# Patient Record
Sex: Male | Born: 1950 | Race: White | Hispanic: No | Marital: Married | Smoking: Never smoker
Health system: Southern US, Community
[De-identification: ages and names within clinical notes are randomized; demographics above are authoritative.]

## PROBLEM LIST (undated history)

## (undated) DIAGNOSIS — E785 Hyperlipidemia, unspecified: Secondary | ICD-10-CM

## (undated) DIAGNOSIS — K635 Polyp of colon: Secondary | ICD-10-CM

## (undated) DIAGNOSIS — I4891 Unspecified atrial fibrillation: Secondary | ICD-10-CM

## (undated) DIAGNOSIS — R011 Cardiac murmur, unspecified: Secondary | ICD-10-CM

## (undated) HISTORY — DX: Cardiac murmur, unspecified: R01.1

## (undated) HISTORY — DX: Hyperlipidemia, unspecified: E78.5

## (undated) HISTORY — PX: PILONIDAL CYST EXCISION: SHX744

## (undated) HISTORY — DX: Polyp of colon: K63.5

## (undated) HISTORY — DX: Unspecified atrial fibrillation: I48.91

## (undated) HISTORY — PX: TONSILLECTOMY: SHX5217

---

## 2005-07-11 ENCOUNTER — Encounter: Payer: Self-pay | Admitting: Gastroenterology

## 2008-04-24 ENCOUNTER — Encounter: Payer: Self-pay | Admitting: Internal Medicine

## 2008-05-05 ENCOUNTER — Encounter: Payer: Self-pay | Admitting: Internal Medicine

## 2008-12-19 ENCOUNTER — Ambulatory Visit: Payer: Self-pay | Admitting: Internal Medicine

## 2008-12-19 ENCOUNTER — Encounter: Payer: Self-pay | Admitting: *Deleted

## 2008-12-19 DIAGNOSIS — E785 Hyperlipidemia, unspecified: Secondary | ICD-10-CM | POA: Insufficient documentation

## 2008-12-19 DIAGNOSIS — Z860100 Personal history of colon polyps, unspecified: Secondary | ICD-10-CM | POA: Insufficient documentation

## 2008-12-22 LAB — CONVERTED CEMR LAB
ALT: 23 units/L (ref 0–53)
Alkaline Phosphatase: 47 units/L (ref 39–117)
Bilirubin, Direct: 0 mg/dL (ref 0.0–0.3)
Cholesterol: 172 mg/dL (ref 0–200)
Total Bilirubin: 1.1 mg/dL (ref 0.3–1.2)
Total Protein: 6.8 g/dL (ref 6.0–8.3)

## 2009-04-30 ENCOUNTER — Ambulatory Visit: Payer: Self-pay | Admitting: Internal Medicine

## 2009-04-30 LAB — CONVERTED CEMR LAB
Alkaline Phosphatase: 36 units/L — ABNORMAL LOW (ref 39–117)
BUN: 12 mg/dL (ref 6–23)
Basophils Absolute: 0.1 10*3/uL (ref 0.0–0.1)
Basophils Relative: 0.9 % (ref 0.0–3.0)
Bilirubin Urine: NEGATIVE
Bilirubin, Direct: 0.1 mg/dL (ref 0.0–0.3)
CO2: 29 meq/L (ref 19–32)
Calcium: 8.6 mg/dL (ref 8.4–10.5)
Cholesterol: 155 mg/dL (ref 0–200)
Creatinine, Ser: 1 mg/dL (ref 0.4–1.5)
Eosinophils Absolute: 0.2 10*3/uL (ref 0.0–0.7)
HDL: 36.1 mg/dL — ABNORMAL LOW (ref >39.00)
Ketones, urine, test strip: NEGATIVE
Lymphocytes Relative: 25.6 % (ref 12.0–46.0)
MCHC: 34.3 g/dL (ref 30.0–36.0)
MCV: 94.1 fL (ref 78.0–100.0)
Monocytes Absolute: 0.4 10*3/uL (ref 0.1–1.0)
Neutrophils Relative %: 63.3 % (ref 43.0–77.0)
Nitrite: NEGATIVE
PSA: 0.59 ng/mL (ref 0.10–4.00)
Platelets: 219 10*3/uL (ref 150.0–400.0)
RBC: 4.48 M/uL (ref 4.22–5.81)
RDW: 11.7 % (ref 11.5–14.6)
Specific Gravity, Urine: 1.02
Total Bilirubin: 1.1 mg/dL (ref 0.3–1.2)
Total CHOL/HDL Ratio: 4
Triglycerides: 95 mg/dL (ref 0.0–149.0)
Urobilinogen, UA: 0.2

## 2009-05-07 ENCOUNTER — Ambulatory Visit: Payer: Self-pay | Admitting: Internal Medicine

## 2009-05-07 DIAGNOSIS — D1801 Hemangioma of skin and subcutaneous tissue: Secondary | ICD-10-CM | POA: Insufficient documentation

## 2009-05-07 DIAGNOSIS — L570 Actinic keratosis: Secondary | ICD-10-CM | POA: Insufficient documentation

## 2009-07-23 ENCOUNTER — Ambulatory Visit: Payer: Self-pay | Admitting: Internal Medicine

## 2009-11-19 ENCOUNTER — Ambulatory Visit: Payer: Self-pay | Admitting: Internal Medicine

## 2009-11-19 LAB — CONVERTED CEMR LAB
ALT: 27 units/L (ref 0–53)
Albumin: 4.4 g/dL (ref 3.5–5.2)
Bilirubin, Direct: 0.1 mg/dL (ref 0.0–0.3)
Cholesterol: 164 mg/dL (ref 0–200)
HDL: 39.4 mg/dL (ref >39.00)
LDL Cholesterol: 103 mg/dL — ABNORMAL HIGH (ref 0–99)
Total Protein: 6.6 g/dL (ref 6.0–8.3)
Triglycerides: 106 mg/dL (ref 0.0–149.0)
VLDL: 21.2 mg/dL (ref 0.0–40.0)

## 2009-11-26 ENCOUNTER — Ambulatory Visit: Payer: Self-pay | Admitting: Internal Medicine

## 2009-11-26 LAB — CONVERTED CEMR LAB
HDL goal, serum: 40 mg/dL
LDL Goal: 130 mg/dL

## 2010-05-03 ENCOUNTER — Ambulatory Visit: Payer: Self-pay | Admitting: Internal Medicine

## 2010-05-03 LAB — CONVERTED CEMR LAB
ALT: 29 units/L (ref 0–53)
AST: 29 units/L (ref 0–37)
Alkaline Phosphatase: 47 units/L (ref 39–117)
Basophils Relative: 0.8 % (ref 0.0–3.0)
Bilirubin, Direct: 0.1 mg/dL (ref 0.0–0.3)
Blood in Urine, dipstick: NEGATIVE
Calcium: 8.5 mg/dL (ref 8.4–10.5)
Creatinine, Ser: 0.9 mg/dL (ref 0.4–1.5)
Eosinophils Relative: 4.3 % (ref 0.0–5.0)
HDL: 38.5 mg/dL — ABNORMAL LOW (ref >39.00)
Hemoglobin: 13.7 g/dL (ref 13.0–17.0)
Ketones, urine, test strip: NEGATIVE
LDL Cholesterol: 108 mg/dL — ABNORMAL HIGH (ref 0–99)
Lymphocytes Relative: 26.5 % (ref 12.0–46.0)
Monocytes Relative: 7.5 % (ref 3.0–12.0)
Neutro Abs: 3.8 10*3/uL (ref 1.4–7.7)
Neutrophils Relative %: 60.9 % (ref 43.0–77.0)
Nitrite: NEGATIVE
RBC: 4.36 M/uL (ref 4.22–5.81)
Sodium: 140 meq/L (ref 135–145)
Specific Gravity, Urine: 1.02
Total CHOL/HDL Ratio: 4
Total Protein: 6.4 g/dL (ref 6.0–8.3)
Triglycerides: 117 mg/dL (ref 0.0–149.0)
Urobilinogen, UA: 0.2
WBC Urine, dipstick: NEGATIVE
WBC: 6.2 10*3/uL (ref 4.5–10.5)

## 2010-05-10 ENCOUNTER — Ambulatory Visit: Payer: Self-pay | Admitting: Internal Medicine

## 2010-06-01 ENCOUNTER — Telehealth: Payer: Self-pay | Admitting: Internal Medicine

## 2010-07-13 ENCOUNTER — Encounter: Payer: Self-pay | Admitting: Internal Medicine

## 2010-07-13 ENCOUNTER — Encounter (INDEPENDENT_AMBULATORY_CARE_PROVIDER_SITE_OTHER): Payer: Self-pay | Admitting: *Deleted

## 2010-07-15 ENCOUNTER — Encounter: Payer: Self-pay | Admitting: Gastroenterology

## 2010-07-30 ENCOUNTER — Encounter: Payer: Self-pay | Admitting: Internal Medicine

## 2010-07-30 ENCOUNTER — Ambulatory Visit
Admission: RE | Admit: 2010-07-30 | Discharge: 2010-07-30 | Payer: Self-pay | Source: Home / Self Care | Attending: Internal Medicine | Admitting: Internal Medicine

## 2010-07-30 ENCOUNTER — Telehealth: Payer: Self-pay

## 2010-07-30 DIAGNOSIS — R109 Unspecified abdominal pain: Secondary | ICD-10-CM | POA: Insufficient documentation

## 2010-07-30 LAB — CONVERTED CEMR LAB
Bilirubin Urine: NEGATIVE
Crystals: NONE SEEN
Glucose, Urine, Semiquant: NEGATIVE
Ketones, urine, test strip: NEGATIVE
Specific Gravity, Urine: 1.015
Squamous Epithelial / LPF: NONE SEEN /lpf
pH: 7

## 2010-08-03 NOTE — Assessment & Plan Note (Signed)
Summary: finger laceration/dm   Vital Signs:  Patient profile:   60 year old male Temp:     98.2 degrees F oral Pulse rate:   72 / minute Resp:     14 per minute  Vitals Entered By: Willy Eddy, LPN (July 23, 2009 8:39 AM)  CC:  thumb laceration last night at 6pm.  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 1 Once Daily  At [facility] 2)  Fish Oil Maximum Strength 1200 Mg Caps (Omega-3 Fatty Acids) .... ( 1700?)  Two Q Am and One  Q Pm 3)  Multivitamins  Caps (Multiple Vitamin) .Marland Kitchen.. 1 Once Daily 4)  Bayer Low Strength 81 Mg Tbec (Aspirin) .Marland Kitchen.. 1 Once Daily 5)  Vitamin D3 1000 Unit Tabs (Cholecalciferol) .... One  By Mouth Daily 6)  No Flush Niacin 500 Mg Tabs (Inositol Niacinate) .... One By Mouth Daily With A Low Fat Meal of Snack  Current Medications (verified): 1)  Simvastatin 20 Mg Tabs (Simvastatin) .Marland Kitchen.. 1 Once Daily  At [facility] 2)  Fish Oil Maximum Strength 1200 Mg Caps (Omega-3 Fatty Acids) .... ( 1700?)  Two Q Am and One  Q Pm 3)  Multivitamins  Caps (Multiple Vitamin) .Marland Kitchen.. 1 Once Daily 4)  Bayer Low Strength 81 Mg Tbec (Aspirin) .Marland Kitchen.. 1 Once Daily 5)  Vitamin D3 1000 Unit Tabs (Cholecalciferol) .... One  By Mouth Daily 6)  No Flush Niacin 500 Mg Tabs (Inositol  Niacinate) .... 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  Past Medical History: Reviewed history from 12/19/2008 and no changes required. Colonic polyps, hx of  last colon  2008  Hyperlipidemia hx of heart murmur  Past Surgical History: Reviewed history from 12/19/2008 and no changes required. pilonidal cyst  Tonsillectomy  Family History: Reviewed history from 12/19/2008 and no changes required. Family History of CAD Male  grandmother Family History High cholesterol   mother Family History of Dementia both parents had memory disorder ( mother is 19) father died with alzhiemers  Social History: Reviewed history from 12/19/2008 and no changes required. 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    Physical Exam  General:  Well-developed,well-nourished,in no acute distress; alert,appropriate and cooperative throughout examination Head:  normocephalic and male-pattern balding.   Lungs:  Normal respiratory effort, chest expands symmetrically. Lungs are clear to auscultation, no crackles or wheezes. Heart:  Normal rate and regular rhythm. S1 and S2 normal without gallop, murmur, click, rub or other extra sounds. Msk:  no joint tenderness, no joint swelling, and no joint warmth.   Skin:  laceration as described in the HPI Cervical Nodes:  No lymphadenopathy noted Axillary Nodes:  No palpable lymphadenopathy Psych:  Cognition and judgment appear intact. Alert and cooperative with normal attention span and concentration. No apparent delusions, illusions, hallucinations   Impression & Recommendations:  Problem # 1:  LACERATION OF FINGER (ICD-883.0) Assessment Unchanged  bleeding wound with shallow laceraton to finger unable to obtain hemostasis for 12 hours not suturable close with steristrips and occussive dressing ( coban) teach dreassing changes and wound care I have spent greater that 30 min face to face evaluating this patient 1/3 was counsilling/ care instructions  Orders: Coban Wrap,  <3 in/yd (U9811) Lacerat Simple STE 0 - 2.5 cm (12001)  Complete Medication List: 1)  Simvastatin 20 Mg Tabs (Simvastatin) .Marland Kitchen.. 1 once daily  at hs 2)  Fish Oil Maximum Strength 1200 Mg Caps (Omega-3 fatty acids) .... ( 1700?)  two q am and one  q pm 3)  Multivitamins Caps (Multiple vitamin) .Marland Kitchen.. 1 once daily 4)  Bayer Low Strength 81 Mg Tbec (Aspirin) .Marland Kitchen.. 1 once daily 5)  Vitamin D3 1000 Unit Tabs (Cholecalciferol) .... One  by mouth daily 6)  No Flush Niacin 500 Mg Tabs (Inositol niacinate) .... One by mouth daily with a low fat meal of snack  Appended Document: finger  laceration/dm     Allergies: No Known Drug Allergies   Impression & Recommendations:  Problem # 1:  LACERATION OF FINGER (ICD-883.0) clarification of prior note I have spent greater that 30 min face to face evaluating this patient of which 1/2 of time or greater was spent in wound care instructions and counsilling  Complete Medication List: 1)  Simvastatin 20 Mg Tabs (Simvastatin) .Marland Kitchen.. 1 once daily  at hs 2)  Fish Oil Maximum Strength 1200 Mg Caps (Omega-3 fatty acids) .... ( 1700?)  two q am and one  q pm 3)  Multivitamins Caps (Multiple vitamin) .Marland Kitchen.. 1 once daily 4)  Bayer Low Strength 81 Mg Tbec (Aspirin) .Marland Kitchen.. 1 once daily 5)  Vitamin D3 1000 Unit Tabs (Cholecalciferol) .... One  by mouth daily 6)  No Flush Niacin 500 Mg Tabs (Inositol niacinate) .... One by mouth daily with a low fat meal of snack

## 2010-08-03 NOTE — Assessment & Plan Note (Signed)
Summary: cpx/njr   Vital Signs:  Patient profile:   60 year old male Height:      70 inches Weight:      172 pounds BMI:     24.77 Temp:     98.2 degrees F oral Pulse rate:   72 / minute Resp:     14 per minute BP sitting:   126 / 78  (left arm)  Vitals Entered By: Willy Eddy, LPN (May 10, 2010 8:58 AM) CC: cpx and form completion Is Patient Diabetic? No   CC:  cpx and form completion.  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 1 Once  At Bed Time 2)  Fish Oil Maximum Strength 1200 Mg Caps (Omega-3 Fatty Acids) .... Two By Mouth Two Times A Day 3)  Multivitamins  Caps (Multiple Vitamin) .Marland Kitchen.. 1 Once Daily 4)  Bayer Low Strength 81 Mg Tbec (Aspirin) .Marland Kitchen.. 1 Once Daily 5)  Vitamin D3 1000 Unit Tabs (Cholecalciferol) .... One  By Mouth Daily 6)  No Flush Niacin 500 Mg Tabs (Inositol Niacinate) .... One By Mouth Daily With A Low Fat Meal of Snack  Current Medications (verified): 1)  Simvastatin 20 Mg Tabs (Simvastatin) .Marland Kitchen.. 1 Once  At Bed Time 2)  Fish Oil Maximum Strength 1200 Mg Caps (Omega-3 Fatty Acids) .... Two By Mouth Two  Times A Day 3)  Multivitamins  Caps (Multiple Vitamin) .Marland Kitchen.. 1 Once Daily 4)  Bayer Low Strength 81 Mg Tbec (Aspirin) .Marland Kitchen.. 1 Once Daily 5)  Vitamin D3 2000 Unit Caps (Cholecalciferol) .Marland Kitchen.. 1 Once Daily 6)  No Flush Niacin 500 Mg Tabs (Inositol Niacinate) .... 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  Past Medical History: Reviewed history from 12/19/2008 and no changes required. Colonic polyps, hx of  last colon  2008  Hyperlipidemia hx of heart murmur  Past Surgical History: Reviewed history from 12/19/2008 and no changes required. pilonidal cyst  Tonsillectomy  Family History: Reviewed history from 12/19/2008 and no changes required. Family History  of CAD Male  grandmother Family History High cholesterol   mother Family History of Dementia both parents had memory disorder ( mother is 64) father died with alzhiemers  Social History: Reviewed history from 12/19/2008 and no changes required. 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    Physical Exam  General:  Well-developed,well-nourished,in no acute distress; alert,appropriate and cooperative throughout examination Head:  normocephalic and male-pattern balding.   Eyes:  pupils equal and pupils round.   Nose:  External nasal examination shows no deformity or inflammation. Nasal mucosa are pink and moist without lesions or exudates. Mouth:  Oral mucosa and oropharynx without lesions or exudates.  Teeth in good repair. Neck:  No deformities, masses, or tenderness noted. Lungs:  Normal respiratory effort, chest expands symmetrically. Lungs are clear to auscultation, no crackles or wheezes. Heart:  Normal rate and regular rhythm. S1 and S2 normal without gallop, murmur, click, rub or other extra sounds. Abdomen:  soft, non-tender, and normal bowel sounds.   Msk:  no joint tenderness, no joint swelling, and no joint warmth.   Pulses:  R and L carotid,radial,femoral,dorsalis pedis and posterior tibial pulses are full and equal bilaterally Extremities:  No clubbing, cyanosis, edema, or deformity noted with normal full range of motion of all joints.   Neurologic:  No cranial nerve deficits noted. Station and gait are normal. Plantar reflexes are down-going bilaterally. DTRs are symmetrical throughout. Sensory, motor and coordinative functions appear intact.   Impression & Recommendations:  Problem # 1:  ROUTINE GENERAL MEDICAL EXAM@[facility] (ICD-V70.0)  Colonoscopy: Adenomatous Polyp (07/05/2007) Td Booster: Historical (04/03/2008)   Flu Vax: Historical (05/04/2010)   Chol: 164 (11/19/2009)   HDL: 39.40 (11/19/2009)   LDL: 103  (11/19/2009)   TG: 106.0 (11/19/2009) TSH: 2.89 (04/30/2009)   PSA: 0.59 (04/30/2009) Next Colonoscopy due:: 07/2012 (12/19/2008)  Discussed using sunscreen, use of alcohol, drug use, self testicular exam, routine dental care, routine eye care, routine physical exam, seat belts, multiple vitamins, osteoporosis prevention, adequate calcium intake in diet, and recommendations for immunizations.  Discussed exercise and checking cholesterol.  Discussed gun safety, safe sex, and contraception. Also recommend checking PSA.  Complete Medication List: 1)  Simvastatin 20 Mg Tabs (Simvastatin) .Marland Kitchen.. 1 once  at bed time 2)  Fish Oil Maximum Strength 1200 Mg Caps (Omega-3 fatty acids) .... Two by mouth two times a day 3)  Multivitamins Caps (Multiple vitamin) .Marland Kitchen.. 1 once daily 4)  Bayer Low Strength 81 Mg Tbec (Aspirin) .Marland Kitchen.. 1 once daily 5)  Vitamin D3 2000 Unit Caps (Cholecalciferol) .Marland Kitchen.. 1 once daily 6)  No Flush Niacin 500 Mg Tabs (Inositol niacinate) .... One by mouth daily with a low fat meal of snack  Patient Instructions: 1)  Please schedule a follow-up appointment in 6 months. 2)  Hepatic Panel prior to visit, ICD-9:995.20 3)  Lipid Panel prior to visit, ICD-9:272.4   Orders Added: 1)  Est. Patient 40-64 years [99396]   Immunization History:  Influenza Immunization History:    Influenza:  historical (05/04/2010)   Immunization History:  Influenza Immunization History:    Influenza:  Historical (05/04/2010)    Appended Document: Orders Update    Clinical Lists Changes  Orders: Added new Service order of Pneumococcal Vaccine (08657) - Signed Added new Service order of Admin 1st Vaccine (84696) - Signed Observations: Added new observation of PNEUMOVAXLOT: 1137aa (05/10/2010 10:09) Added new observation of PNEUMOVAXEXP: 10/28/2011 (05/10/2010 10:09) Added new observation of PNEUMOVAXBY: Willy Eddy, LPN (29/52/8413 10:09) Added new observation of PNEUMOVAXRTE: IM  (05/10/2010 10:09) Added new observation of PNEUMOVAXDOS: 0.5 ml (05/10/2010 10:09) Added new observation of PNEUMOVAXMFR: Merck (05/10/2010 10:09) Added new observation of PNEUMOVAXSIT: right deltoid (05/10/2010 10:09) Added new observation of PNEUMOVAX: Pneumovax (05/10/2010 10:09)       Immunizations Administered:  Pneumonia Vaccine:    Vaccine Type: Pneumovax    Site: right deltoid    Mfr: Merck    Dose: 0.5 ml    Route: IM    Given by: Willy Eddy, LPN    Exp. Date: 10/28/2011    Lot #: [id number]

## 2010-08-03 NOTE — Assessment & Plan Note (Signed)
Summary: 6 month rov/njr rsc bmp/njr   Vital Signs:  Patient profile:   60 year old male Height:      70 inches Weight:      170 pounds BMI:     24.48 Temp:     98.2 degrees F oral Pulse rate:   72 / minute Resp:     14 per minute BP sitting:   122 / 78  (left arm)  Vitals Entered By: Willy Eddy, LPN (Nov 26, 2009 9:30 AM) CC: roa labs, Lipid Management   CC:  roa labs and Lipid Management.  Lipid Management History:      Positive NCEP/ATP III risk factors include male age 33 years old or older and HDL cholesterol less than 40.  Negative NCEP/ATP III risk factors include non-tobacco-user status.     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 1 Once Daily  At [facility] 2)  Fish Oil Maximum Strength 1200 Mg Caps (Omega-3 Fatty Acids) .... ( 1700?)  Two Q Am and One  Q Pm 3)  Multivitamins  Caps (Multiple Vitamin) .Marland Kitchen.. 1 Once Daily 4)  Bayer Low Strength 81 Mg Tbec (Aspirin) .Marland Kitchen.. 1 Once Daily 5)  Vitamin D3 1000 Unit Tabs (Cholecalciferol) .... One  By Mouth Daily 6)  No Flush Niacin 500 Mg Tabs (Inositol Niacinate) .... 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  Past Medical History: Reviewed history from 12/19/2008 and no changes required. Colonic polyps, hx of  last colon  2008  Hyperlipidemia hx of heart murmur  Past Surgical History: Reviewed history from 12/19/2008 and no changes required. pilonidal cyst  Tonsillectomy  Family History: Reviewed history from 12/19/2008 and no changes required. Family History of CAD Male  grandmother Family History High cholesterol   mother Family History of Dementia both parents had memory disorder ( mother is 57) father died with alzhiemers  Social History: Reviewed history from 12/19/2008 and no changes required. 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    Physical Exam  General:  Well-developed,well-nourished,in no acute distress; alert,appropriate and cooperative throughout examination Head:  normocephalic and male-pattern balding.   Ears:  R ear normal and L ear normal.   Nose:  External nasal examination shows no deformity  or inflammation. Nasal mucosa are pink and moist without lesions or exudates. Mouth:  Oral mucosa and oropharynx without lesions or exudates.  Teeth in good repair. Neck:  No deformities, masses, or tenderness noted. Lungs:  Normal respiratory effort, chest expands symmetrically. Lungs are clear to auscultation, no crackles or wheezes. Heart:  Normal rate and regular rhythm. S1 and S2 normal without gallop, murmur, click, rub or other extra sounds. Abdomen:  soft, non-tender, and normal bowel sounds.   Msk:  no joint tenderness, no joint swelling, and no joint warmth.   Pulses:  R and L carotid,radial,femoral,dorsalis pedis and posterior tibial pulses are full and equal bilaterally Extremities:  No clubbing, cyanosis, edema, or deformity noted with normal full range of motion of all joints.   Neurologic:  No cranial nerve deficits noted. Station and gait are normal. Plantar reflexes are down-going bilaterally. DTRs are symmetrical throughout. Sensory, motor and coordinative functions appear intact.   Impression & Recommendations:  Problem # 1:  HYPERLIPIDEMIA (ICD-272.4) Assessment Unchanged  His updated medication list for this problem includes:    Simvastatin 20 Mg Tabs (Simvastatin) .Marland Kitchen... 1 once  at bed time  Labs Reviewed: SGOT: 26 (11/19/2009)   SGPT: 27 (11/19/2009)   HDL:39.40 (11/19/2009), 36.10 (04/30/2009)  LDL:103 (11/19/2009), 100 (04/30/2009)  Chol:164 (11/19/2009), 155 (04/30/2009)  Trig:106.0 (11/19/2009), 95.0 (04/30/2009)  Problem # 2:  HYPERLIPIDEMIA (ICD-272.4) Assessment: Unchanged  His updated medication list for this problem includes:    Simvastatin 20 Mg Tabs (Simvastatin) .Marland Kitchen... 1 once  at bed time  Labs Reviewed: SGOT: 26 (11/19/2009)   SGPT: 27 (11/19/2009)  Lipid Goals: Chol Goal: 200 (11/26/2009)   HDL Goal: 40 (11/26/2009)   LDL Goal: 130 (11/26/2009)   TG Goal: 150 (11/26/2009)  10 Yr Risk Heart Disease: 11 %   HDL:39.40 (11/19/2009), 36.10  (04/30/2009)  LDL:103 (11/19/2009), 100 (04/30/2009)  Chol:164 (11/19/2009), 155 (04/30/2009)  Trig:106.0 (11/19/2009), 95.0 (04/30/2009)  Problem # 3:  ACTINIC KERATOSIS, HEAD (ICD-702.0) exam  Complete Medication List: 1)  Simvastatin 20 Mg Tabs (Simvastatin) .Marland Kitchen.. 1 once  at bed time 2)  Fish Oil Maximum Strength 1200 Mg Caps (Omega-3 fatty acids) .... Two by mouth two times a day 3)  Multivitamins Caps (Multiple vitamin) .Marland Kitchen.. 1 once daily 4)  Bayer Low Strength 81 Mg Tbec (Aspirin) .Marland Kitchen.. 1 once daily 5)  Vitamin D3 1000 Unit Tabs (Cholecalciferol) .... One  by mouth daily 6)  No Flush Niacin 500 Mg Tabs (Inositol niacinate) .... One by mouth daily with a low fat meal of snack  Lipid Assessment/Plan:      Based on NCEP/ATP III, the patient's risk factor category is "2 or more risk factors and a calculated 10 year CAD risk of < 20%".  The patient's lipid goals are as follows: Total cholesterol goal is 200; LDL cholesterol goal is 130; HDL cholesterol goal is 40; Triglyceride goal is 150.    Patient Instructions: 1)  ate oct/ early nov CPX Prescriptions: SIMVASTATIN 20 MG TABS (SIMVASTATIN) 1 once  at bed time  #90 x 3   Entered and Authorized by:   Stacie Glaze MD   Signed by:   Stacie Glaze MD on 11/26/2009   Method used:  Electronically to        [id number] (retail)       [address] [place], [place]  [address]       Ph: [phone]       Fax: [phone]   RxID:   [id number]

## 2010-08-03 NOTE — Progress Notes (Signed)
Summary: REQUEST FOR REFERRAL  Phone Note Call from Patient   Caller: Patient  Summary of Call: Pt would like to have a referral for screening colonoscopy.... Pt adv that he has one every 5 years and per pt is due for same.... Pt can be reached at [phone].  Initial call taken by: Debbra Riding,  June 01, 2010 4:27 PM

## 2010-08-05 ENCOUNTER — Encounter (INDEPENDENT_AMBULATORY_CARE_PROVIDER_SITE_OTHER): Payer: Self-pay | Admitting: *Deleted

## 2010-08-05 NOTE — Assessment & Plan Note (Signed)
Summary: mid abd pain for 4 days/bmw   Vital Signs:  Patient profile:   60 year old male Weight:      171 pounds Pulse rate:   76 / minute BP sitting:   120 / 80  (left arm)  Vitals Entered By: Kyung Rudd, CMA (July 30, 2010 8:53 AM) CC: pt c/o lower abd pain x 5days   CC:  pt c/o lower abd pain x 5days.  History of Present Illness: Patient presents to clinic as a workin for evaluation of abdominal discomfort. Notes 5d h/o low mid abd discomfort with feeling described as bloating sensation and need to defecate. Does not improve with defecation and denies constipation, diarrhea or blood in stool.No radiation of discomfort.  Denies urinary symptoms. No alleviating or exacerbating factors. Does believe symptoms began after using preparation H supp and using them reproduces similar symptoms. Stopped using supp several days ago and no improvement of symptoms. Scheduled for routine colonoscopy in near future. Requests ambien 5mg  #4 for international trip in near future.    Current Medications (verified): 1)  Simvastatin 20 Mg Tabs (Simvastatin) .Marland Kitchen.. 1 Once  At Bed Time 2)  Fish Oil Maximum Strength 1200 Mg Caps (Omega-3 Fatty Acids) .... Two By Mouth Two Times A Day 3)  Multivitamins  Caps (Multiple Vitamin) .Marland Kitchen.. 1 Once Daily 4)  Bayer Low Strength 81 Mg Tbec (Aspirin) .Marland Kitchen.. 1 Once Daily 5)  Vitamin D3 2000 Unit Caps (Cholecalciferol) .Marland Kitchen.. 1 Once Daily 6)  No Flush Niacin 500 Mg Tabs (Inositol Niacinate) .... One By Mouth Daily With A Low Fat Meal of Snack  Allergies (verified): No Known Drug Allergies  Past History:  Past medical, surgical, family and social histories (including risk factors) reviewed for relevance to current acute and chronic problems.  Past Medical History: Reviewed history from 12/19/2008 and no changes required. Colonic polyps, hx of  last colon  2008  Hyperlipidemia hx of heart murmur  Past Surgical History: Reviewed history from 12/19/2008 and no  changes required. pilonidal cyst  Tonsillectomy  Family History: Reviewed history from 12/19/2008 and no changes required. Family History of CAD Male  grandmother Family History High cholesterol   mother Family History of Dementia both parents had memory disorder ( mother is 66) father died with alzhiemers  Social History: Reviewed history from 12/19/2008 and no changes required. Occupation:Phd. Media planner Never Smoked Alcohol use-yes Drug use-no  Review of Systems      See HPI  Physical Exam  General:  Well-developed,well-nourished,in no acute distress; alert,appropriate and cooperative throughout examination Head:  Normocephalic and atraumatic without obvious abnormalities. No apparent alopecia or balding. Abdomen:  soft, non-tender, no distention, no masses, no guarding, no rigidity, no rebound tenderness, no hepatomegaly, and no splenomegaly.  Slight discomfort to palpation hypogastric/suprapubic Skin:  turgor normal, color normal, and no rashes.     Impression & Recommendations:  Problem # 1:  ABDOMINAL PAIN, UNSPECIFIED SITE (ICD-789.00) Assessment New Obtain UA and 2 view abd radiograph. Avoid supp. Keep colonoscopy appt. Followup if no improvement or worsening.  Orders: UA Dipstick w/o Micro (automated)  (81003) T-Abdomen 2-view (74020TC)  Complete Medication List: 1)  Simvastatin 20 Mg Tabs (Simvastatin) .Marland Kitchen.. 1 once  at bed time 2)  Fish Oil Maximum Strength 1200 Mg Caps (Omega-3 fatty acids) .... Two by mouth two times a day 3)  Multivitamins Caps (Multiple vitamin) .Marland Kitchen.. 1 once daily 4)  Bayer Low Strength 81 Mg Tbec (Aspirin) .Marland Kitchen.. 1 once daily 5)  Vitamin D3  2000 Unit Caps (Cholecalciferol) .Marland Kitchen.. 1 once daily 6)  No Flush Niacin 500 Mg Tabs (Inositol niacinate) .... One by mouth daily with a low fat meal of snack 7)  Ambien 5 Mg Tabs (Zolpidem tartrate) .... One by mouth at bedtime as needed insomnia Prescriptions: AMBIEN 5 MG TABS (ZOLPIDEM  TARTRATE) one by mouth at bedtime as needed insomnia  #4 x 0   Entered and Authorized by:   Edwyna Perfect MD   Signed by:   Edwyna Perfect MD on 07/30/2010   Method used:   Print then Give to Patient   RxID:   [id number]    Orders Added: 1)  UA Dipstick w/o Micro (automated)  [81003] 2)  T-Abdomen 2-view [74020TC] 3)  Est. Patient Level III [62130]    Laboratory Results   Urine Tests    Routine Urinalysis   Color: yellow Appearance: Clear Glucose: negative   (Normal Range: Negative) Bilirubin: negative   (Normal Range: Negative) Ketone: negative   (Normal Range: Negative) Spec. Gravity: 1.015   (Normal Range: 1.003-1.035) Blood: trace-intact   (Normal Range: Negative) pH: 7.0   (Normal Range: 5.0-8.0) Protein: negative   (Normal Range: Negative) Urobilinogen: 0.2   (Normal Range: 0-1) Nitrite: negative   (Normal Range: Negative) Leukocyte Esterace: negative   (Normal Range: Negative)    Comments: Rita Ohara  July 30, 2010 9:45 AM     Appended Document: Orders Update    Clinical Lists Changes  Orders: Added new Test order of T-Urine Microscopic [phone]) - Signed

## 2010-08-05 NOTE — Letter (Signed)
Summary: Pre Visit Letter Revised  [facility]  [address] [place], [place] [address]   Phone: [phone]  Fax: [phone]        07/15/2010 MRN: [id number]  [facility] [address] [place], [place]  [address]             Procedure Date:  08-20-10 8:30am           Dr Russella Dar - Direct Colon   Welcome to the Gastroenterology Division at [facility].    You are scheduled to see a nurse for your pre-procedure visit on 08-06-10 at 8am on the 3rd floor at [facility], [address]  We ask that you try to arrive at our office 15 minutes prior to your appointment time to allow for check-in.  Please take a minute to review the attached form.  If you answer "Yes" to one or more of the questions on the first page, we ask that you call the person listed at your earliest opportunity.  If you answer "No" to all of the questions, please complete the rest of the form and bring it to your appointment.    Your nurse visit will consist of discussing your medical and surgical history, your immediate family medical history, and your medications.   If you are unable to list all of your medications on the form, please bring the medication bottles to your appointment and we will list them.  We will need to be aware of both prescribed and over the counter drugs.  We will need to know exact dosage information as well.    Please be prepared to read and sign documents such as consent forms, a financial agreement, and acknowledgement forms.  If necessary, and with your consent, a friend or relative is welcome to sit-in on the nurse visit with you.  Please bring your insurance card so that we may make a copy of it.  If your insurance requires a referral to see a specialist, please bring your referral form from your primary care physician.  No co-pay is required for this nurse visit.     If you cannot keep your appointment, please call [phone] to cancel or reschedule prior  to your appointment date.  This allows [place] the opportunity to schedule an appointment for another patient in need of care.    Thank you for choosing [facility] for your medical needs.  We appreciate the opportunity to care for you.  Please visit [place] at our website  to learn more about our practice.  Sincerely, The Gastroenterology Division

## 2010-08-05 NOTE — Miscellaneous (Signed)
Summary: Orders Update   Clinical Lists Changes  Orders: Added new Referral order of Gastroenterology Referral (GI) - Signed 

## 2010-08-05 NOTE — Letter (Signed)
Summary: Referral - not able to see patient  [facility]  [address] [place], [place] [address]   Phone: [phone]  Fax: [phone]    July 13, 2010   Darryll Capers, MD [address] [place], [place] [address]   Re:   Julian Crawford DOB:  05-Aug-1950 MRN:   [id number]    Dear Dr. Lovell Sheehan:  Thank you for your kind referral of the above patient.  We have attempted to schedule the recommended procedure Screening Colonoscopy but have not been able to schedule because:  X   The patient was not available by phone and/or has not returned our calls.  ___ The patient declined to schedule the procedure at this time.  We appreciate the referral and hope that we will have the opportunity to treat this patient in the future.    Sincerely,    [facility] [phone]

## 2010-08-05 NOTE — Progress Notes (Signed)
----   Converted from flag ---- ---- 07/30/2010 2:56 PM, Edwyna Perfect MD wrote: abd xray nl.  moderate amount of feces throughout colon. wouldn't be wrong to try a mild laxative once. also ua showed trace blood. can we run micro on sample in lab to see if RBCs? ------------------------------  Phone Note Outgoing Call   Call placed by: Kyung Rudd, CMA,  July 30, 2010 4:01 PM Call placed to: Patient Summary of Call: left message to call back Initial call taken by: Kyung Rudd, CMA,  July 30, 2010 4:01 PM  Follow-up for Phone Call        pt aware and verbalized understanding Follow-up by: Kyung Rudd, CMA,  July 30, 2010 4:05 PM

## 2010-08-06 ENCOUNTER — Encounter: Payer: Self-pay | Admitting: Gastroenterology

## 2010-08-06 ENCOUNTER — Ambulatory Visit: Admit: 2010-08-06 | Payer: Self-pay | Admitting: Gastroenterology

## 2010-08-11 NOTE — Letter (Signed)
Summary: [facility] Instructions  [facility]  [address] [place], [place] [address]   Phone: [phone]  Fax: [phone]       Julian Crawford    23-Dec-1950    MRN: [id number]        Procedure Day Dorna Bloom:  Farrell Ours  08/20/10     Arrival Time:  7:30AM     Procedure Time:  8:30AM     Location of Procedure:                    _ X_  [facility] (4th Floor)                      PREPARATION FOR COLONOSCOPY WITH MOVIPREP   Starting 5 days prior to your procedure 08/15/10 do not eat nuts, seeds, popcorn, corn, beans, peas,  salads, or any raw vegetables.  Do not take any fiber supplements (e.g. Metamucil, Citrucel, and Benefiber).  THE DAY BEFORE YOUR PROCEDURE         DATE: 08/19/10   DAY: THURSDAY  1.  Drink clear liquids the entire day-NO SOLID FOOD  2.  Do not drink anything colored red or purple.  Avoid juices with pulp.  No orange juice.  3.  Drink at least 64 oz. (8 glasses) of fluid/clear liquids during the day to prevent dehydration and help the prep work efficiently.  CLEAR LIQUIDS INCLUDE: Water Jello Ice Popsicles Tea (sugar ok, no milk/cream) Powdered fruit flavored drinks Coffee (sugar ok, no milk/cream) Gatorade Juice: apple, white grape, white cranberry  Lemonade Clear bullion, consomm, broth Carbonated beverages (any kind) Strained chicken noodle soup Hard Candy                             4.  In the morning, mix first dose of MoviPrep solution:    Empty 1 Pouch A and 1 Pouch B into the disposable container    Add lukewarm drinking water to the top line of the container. Mix to dissolve    Refrigerate (mixed solution should be used within 24 hrs)  5.  Begin drinking the prep at 5:00 p.m. The MoviPrep container is divided by 4 marks.   Every 15 minutes drink the solution down to the next mark (approximately 8 oz) until the full liter is complete.   6.  Follow completed prep with 16 oz of clear liquid of your choice (Nothing  red or purple).  Continue to drink clear liquids until bedtime.  7.  Before going to bed, mix second dose of MoviPrep solution:    Empty 1 Pouch A and 1 Pouch B into the disposable container    Add lukewarm drinking water to the top line of the container. Mix to dissolve    Refrigerate  THE DAY OF YOUR PROCEDURE      DATE: 08/20/10  DAY: FRIDAY  Beginning at 3:30AM (5 hours before procedure):         1. Every 15 minutes, drink the solution down to the next mark (approx 8 oz) until the full liter is complete.  2. Follow completed prep with 16 oz. of clear liquid of your choice.    3. You may drink clear liquids until 6:30AM (2 HOURS BEFORE PROCEDURE).   MEDICATION INSTRUCTIONS  Unless otherwise instructed, you should take regular prescription medications with a small sip of water   as early as possible the morning of  your procedure.         OTHER INSTRUCTIONS  You will need a responsible adult at least 62 years of age to accompany you and drive you home.   This person must remain in the waiting room during your procedure.  Wear loose fitting clothing that is easily removed.  Leave jewelry and other valuables at home.  However, you may wish to bring a book to read or  an iPod/MP3 player to listen to music as you wait for your procedure to start.  Remove all body piercing jewelry and leave at home.  Total time from sign-in until discharge is approximately 2-3 hours.  You should go home directly after your procedure and rest.  You can resume normal activities the  day after your procedure.  The day of your procedure you should not:   Drive   Make legal decisions   Operate machinery   Drink alcohol   Return to work  You will receive specific instructions about eating, activities and medications before you leave.    The above instructions have been reviewed and explained to me by   Wyona Almas RN  August 06, 2010 8:41 AM     I fully understand and  can verbalize these instructions _____________________________ Date _________

## 2010-08-11 NOTE — Miscellaneous (Signed)
Summary: [facility] Previsit/prep  Clinical Lists Changes  Medications: Added new medication of MOVIPREP 100 GM  SOLR (PEG-KCL-NACL-NASULF-NA ASC-C) As per prep instructions. - Signed Rx of MOVIPREP 100 GM  SOLR (PEG-KCL-NACL-NASULF-NA ASC-C) As per prep instructions.;  #1 x 0;  Signed;  Entered by: Wyona Almas RN;  Authorized by: Meryl Dare MD [facility];  Method used: Electronically to [id number] [address] [place], [place], [place]  [address], Ph: [phone], Fax: [phone] Observations: Added new observation of NKA: T (08/06/2010 8:12)    Prescriptions: MOVIPREP 100 GM  SOLR (PEG-KCL-NACL-NASULF-NA ASC-C) As per prep instructions.  #1 x 0   Entered by:   Wyona Almas RN   Authorized by:   Meryl Dare MD [facility]   Signed by:   Wyona Almas RN on 08/06/2010   Method used:   Electronically to        [id number] (retail)       [address] [place], [place]  [address]       Ph: [phone]       Fax: [phone]   RxID:   [id number]

## 2010-08-20 ENCOUNTER — Other Ambulatory Visit (AMBULATORY_SURGERY_CENTER): Payer: 59 | Admitting: Gastroenterology

## 2010-08-20 ENCOUNTER — Encounter: Payer: Self-pay | Admitting: Gastroenterology

## 2010-08-20 DIAGNOSIS — Z1211 Encounter for screening for malignant neoplasm of colon: Secondary | ICD-10-CM

## 2010-08-20 DIAGNOSIS — Z86010 Personal history of colonic polyps: Secondary | ICD-10-CM

## 2010-08-20 DIAGNOSIS — K5730 Diverticulosis of large intestine without perforation or abscess without bleeding: Secondary | ICD-10-CM

## 2010-08-20 DIAGNOSIS — K648 Other hemorrhoids: Secondary | ICD-10-CM

## 2010-08-20 LAB — HM COLONOSCOPY

## 2010-08-25 NOTE — Procedures (Signed)
Summary: Colonoscopty/Maminta Napoleon D.O.  Colonoscopty/Maminta Napoleon D.O.   Imported By: Sherian Rein 08/17/2010 12:46:24  _____________________________________________________________________  External Attachment:    Type:   Image     Comment:   External Document

## 2010-08-25 NOTE — Procedures (Addendum)
Summary: Colonoscopy  Patient: [name] Note: All result statuses are Final unless otherwise noted.  Tests: (1) Colonoscopy (COL)   COL Colonoscopy           DONE     [facility]     [address]     [place], [place]  [address]           COLONOSCOPY PROCEDURE REPORT     PATIENT:  Julian Crawford, Julian Crawford  MR#:  [id number]     BIRTHDATE:  Jul 15, 1950, 59 yrs. old  GENDER:  male     ENDOSCOPIST:  Judie Petit T. Russella Dar, MD, [facility]     Referred by:  Stacie Glaze, M.D.     PROCEDURE DATE:  08/20/2010     PROCEDURE:  Colonoscopy [address]     ASA CLASS:  Class II     INDICATIONS:  1) surveillance and high-risk screening  2) history     of pre-cancerous (adenomatous) colon polyps: 07/2005.     MEDICATIONS:   Fentanyl 100 mcg IV, Versed 10 mg IV     DESCRIPTION OF PROCEDURE:   After the risks benefits and     alternatives of the procedure were thoroughly explained, informed     consent was obtained.  Digital rectal exam was performed and     revealed no abnormalities.   The LB [id number] endoscope     was introduced through the anus and advanced to the cecum, which     was identified by both the appendix and ileocecal valve, without     limitations.  The quality of the prep was good, using MoviPrep.     The instrument was then slowly withdrawn as the colon was fully     examined.     <<PROCEDUREIMAGES>>     FINDINGS:  Moderate diverticulosis was found in the sigmoid to     descending colon. A normal appearing cecum, ileocecal valve, and     appendiceal orifice were identified. The ascending, hepatic     flexure, transverse, splenic flexure, and rectum appeared     unremarkable. Retroflexed views in the rectum revealed internal     hemorrhoids, moderate.  The time to cecum =  3.75  minutes. The     scope was then withdrawn (time =  8.75  min) from the patient and     the procedure completed.           COMPLICATIONS:  None           ENDOSCOPIC IMPRESSION:     1) Moderate  diverticulosis in the sigmoid to descending colon     2) Internal hemorrhoids           RECOMMENDATIONS:     1) High fiber diet with liberal fluid intake.     2) Repeat Colonoscopy in 5 years.           Venita Lick. Russella Dar, MD, [facility]           n.     eSIGNED:   Venita Lick. Perkins Molina at 08/20/2010 09:02 AM           Rolland Porter, [id number]  Note: An exclamation mark (!) indicates a result that was not dispersed into the flowsheet. Document Creation Date: 08/20/2010 9:03 AM _______________________________________________________________________  (1) Order result status: Final Collection or observation date-time: 08/20/2010 08:54 Requested date-time:  Receipt date-time:  Reported date-time:  Referring Physician:   Ordering Physician: Claudette Head [phone]) Specimen Source:  Source: Launa Grill  Order Number: [id number] Lab site:   Appended Document: Colonoscopy    Clinical Lists Changes  Observations: Added new observation of COLONNXTDUE: 08/2015 (08/23/2010 10:44)

## 2010-11-01 ENCOUNTER — Other Ambulatory Visit (INDEPENDENT_AMBULATORY_CARE_PROVIDER_SITE_OTHER): Payer: 59 | Admitting: Internal Medicine

## 2010-11-01 DIAGNOSIS — T887XXA Unspecified adverse effect of drug or medicament, initial encounter: Secondary | ICD-10-CM

## 2010-11-01 DIAGNOSIS — E785 Hyperlipidemia, unspecified: Secondary | ICD-10-CM

## 2010-11-01 LAB — HEPATIC FUNCTION PANEL
ALT: 27 U/L (ref 0–53)
AST: 30 U/L (ref 0–37)
Alkaline Phosphatase: 43 U/L (ref 39–117)
Bilirubin, Direct: 0.1 mg/dL (ref 0.0–0.3)
Total Protein: 6.4 g/dL (ref 6.0–8.3)

## 2010-11-01 LAB — LIPID PANEL
HDL: 39.6 mg/dL (ref >39.00)
Total CHOL/HDL Ratio: 4

## 2010-11-05 ENCOUNTER — Encounter: Payer: Self-pay | Admitting: Internal Medicine

## 2010-11-10 ENCOUNTER — Encounter: Payer: Self-pay | Admitting: Internal Medicine

## 2010-11-10 ENCOUNTER — Ambulatory Visit (INDEPENDENT_AMBULATORY_CARE_PROVIDER_SITE_OTHER): Payer: 59 | Admitting: Internal Medicine

## 2010-11-10 VITALS — BP 120/74 | HR 72 | Temp 98.0°F | Resp 14 | Ht 70.0 in | Wt 168.0 lb

## 2010-11-10 DIAGNOSIS — Z0000 Encounter for general adult medical examination without abnormal findings: Secondary | ICD-10-CM

## 2010-11-10 DIAGNOSIS — E785 Hyperlipidemia, unspecified: Secondary | ICD-10-CM

## 2010-11-10 DIAGNOSIS — Z2911 Encounter for prophylactic immunotherapy for respiratory syncytial virus (RSV): Secondary | ICD-10-CM

## 2010-11-10 MED ORDER — SIMVASTATIN 20 MG PO TABS: 20.0000 mg | ORAL_TABLET | Freq: Every day | ORAL | Status: DC

## 2010-11-10 MED ORDER — ZOSTER VACCINE LIVE 19400 UNT/0.65ML ~~LOC~~ SOLR: 0.6500 mL | Freq: Once | SUBCUTANEOUS | Status: DC

## 2010-11-11 ENCOUNTER — Encounter: Payer: Self-pay | Admitting: Internal Medicine

## 2010-11-11 NOTE — Progress Notes (Signed)
Subjective:    Patient ID: Julian Crawford, male    DOB: 01-16-1951, 60 y.o.   MRN: [id number]  HPI  This is a routine office visit for followup of hyperlipidemia  Review of Systems     Objective:   Physical Exam        Assessment & Plan:   Subjective:    Julian Crawford is a 60 y.o. male here for follow up of dyslipidemia. The patient does not use medications that may worsen dyslipidemias (corticosteroids, progestins, anabolic steroids, diuretics, beta-blockers, amiodarone, cyclosporine, olanzapine). The patient exercises infrequently. The patient is known to have coexisting coronary artery disease.   Cardiac Risk Factors Age > 45-male, > 55-male:  YES  +1  Smoking:   NO  Sig. family hx of CHD*:  YES  +1  Hypertension:   NO  Diabetes:   NO  HDL < 35:   NO  HDL > 59:   NO  Total: 2   *- Sig. family h/o CHD per NCEP = MI or sudden death at <55yo in  father or other 1st-degree male relative, or <65yo in mother or  other 1st-degree male relative  The following portions of the patient's history were reviewed and updated as appropriate: allergies, current medications, past family history, past medical history, past social history, past surgical history and problem list.  Review of Systems A comprehensive review of systems was negative.    Objective:    BP 120/74  Pulse 72  Temp(Src) 98 F (36.7 C) (Oral)  Resp 14  Ht 5\' 10"  (1.778 m)  Wt 168 lb (76.204 kg)  BMI 24.11 kg/m2  General Appearance:    Alert, cooperative, no distress, appears stated age  Head:    Normocephalic, without obvious abnormality, atraumatic  Eyes:    PERRL, conjunctiva/corneas clear, EOM's intact, fundi    benign, both eyes       Ears:    Normal TM's and external ear canals, both ears  Nose:   Nares normal, septum midline, mucosa normal, no drainage    or sinus tenderness  Throat:   Lips, mucosa, and tongue normal; teeth and gums normal  Neck:   Supple, symmetrical, trachea midline, no  adenopathy;       thyroid:  No enlargement/tenderness/nodules; no carotid   bruit or JVD  Back:     Symmetric, no curvature, ROM normal, no CVA tenderness  Lungs:     Clear to auscultation bilaterally, respirations unlabored  Chest wall:    No tenderness or deformity  Heart:    Regular rate and rhythm, S1 and S2 normal, no murmur, rub   or gallop  Abdomen:     Soft, non-tender, bowel sounds active all four quadrants,    no masses, no organomegaly  Genitalia:    Normal male without lesion, discharge or tenderness  Rectal:    Normal tone, normal prostate, no masses or tenderness;   guaiac negative stool  Extremities:   Extremities normal, atraumatic, no cyanosis or edema  Pulses:   2+ and symmetric all extremities  Skin:   Skin color, texture, turgor normal, no rashes or lesions  Lymph nodes:   Cervical, supraclavicular, and axillary nodes normal  Neurologic:   CNII-XII intact. Normal strength, sensation and reflexes      throughout    Lab Review Lab Results  Component Value Date   CHOL 172 11/01/2010   CHOL 170 05/03/2010   CHOL 164 11/19/2009   HDL 39.60 11/01/2010   HDL 91.47* 05/03/2010  HDL 39.40 11/19/2009   LDLDIRECT 110.9 12/19/2008      Assessment:    Dyslipidemia as detailed above with 2 CHD risk factors using NCEP scheme above.  Target levels for LDL are: < 100 mg/dl (CHD or "CHD risk equivalent" is present)  Explained to the patient the respective contributions of genetics, diet, and exercise to lipid levels and the use of medication in severe cases which do not respond to lifestyle alteration. The patient's interest and motivation in making lifestyle changes seems excellent.    Plan:    The following changes are planned for the next 6 months, at which time the patient will return for repeat fasting lipids:  1. Dietary changes: Increase soluble fiber 2. Exercise changes:  Advised to engage a physical trainer 3. Other treatment: None 4. Lipid-lowering medications:  continue statin  (Recommended by NCEP after 3-6 mos of dietary therapy & lifestyle modification,  except if CHD is present or LDL well above 190.) 5.None indicated 8. Follow up: 6 month.  Note: The majority of the visit was spent in counseling on the pathophysiology and treatment of dyslipidemias. The total face-to-face time was in excess of 30 minutes.

## 2011-01-26 ENCOUNTER — Telehealth: Payer: Self-pay | Admitting: Internal Medicine

## 2011-01-26 NOTE — Telephone Encounter (Signed)
No dermatologist that he knows of that does that per dr jenkins---dr Lovell Sheehan suggested advanced laser and skin rejuevination and the number was given [phone]

## 2011-01-26 NOTE — Telephone Encounter (Signed)
Patient called 7/25. States he spoke with Dr. Lovell Sheehan in both Nov. 2011 and May 2012 about a referral to Dermatology to have some rosacea lines taken off his face with laser treatments. Patient was not sure that the referral was ever solidified, however, and I see no referral in the system. Patient is now definitely requesting a referral from Dr. Lovell Sheehan to a [place] dermatologist to have rosacea looked at and taken care of.

## 2011-05-10 ENCOUNTER — Other Ambulatory Visit (INDEPENDENT_AMBULATORY_CARE_PROVIDER_SITE_OTHER): Payer: 59

## 2011-05-10 DIAGNOSIS — Z0000 Encounter for general adult medical examination without abnormal findings: Secondary | ICD-10-CM

## 2011-05-10 LAB — CBC WITH DIFFERENTIAL/PLATELET
Basophils Absolute: 0 10*3/uL (ref 0.0–0.1)
Hemoglobin: 13.9 g/dL (ref 13.0–17.0)
Lymphocytes Relative: 22.8 % (ref 12.0–46.0)
Monocytes Relative: 6.4 % (ref 3.0–12.0)
Platelets: 246 10*3/uL (ref 150.0–400.0)
RDW: 12.4 % (ref 11.5–14.6)

## 2011-05-10 LAB — LIPID PANEL
HDL: 42.2 mg/dL (ref >39.00)
LDL Cholesterol: 112 mg/dL — ABNORMAL HIGH (ref 0–99)
Total CHOL/HDL Ratio: 4
Triglycerides: 99 mg/dL (ref 0.0–149.0)

## 2011-05-10 LAB — POCT URINALYSIS DIPSTICK
Leukocytes, UA: NEGATIVE
Nitrite, UA: NEGATIVE
Protein, UA: NEGATIVE
pH, UA: 7

## 2011-05-10 LAB — HEPATIC FUNCTION PANEL
ALT: 23 U/L (ref 0–53)
Alkaline Phosphatase: 48 U/L (ref 39–117)
Bilirubin, Direct: 0.1 mg/dL (ref 0.0–0.3)
Total Bilirubin: 1 mg/dL (ref 0.3–1.2)

## 2011-05-10 LAB — BASIC METABOLIC PANEL
Chloride: 104 mEq/L (ref 96–112)
Creatinine, Ser: 1 mg/dL (ref 0.4–1.5)
Sodium: 141 mEq/L (ref 135–145)

## 2011-05-10 LAB — TSH: TSH: 3.15 u[IU]/mL (ref 0.35–5.50)

## 2011-05-11 LAB — VITAMIN D 25 HYDROXY (VIT D DEFICIENCY, FRACTURES): Vit D, 25-Hydroxy: 57 ng/mL (ref 30–89)

## 2011-05-17 ENCOUNTER — Ambulatory Visit (INDEPENDENT_AMBULATORY_CARE_PROVIDER_SITE_OTHER): Payer: 59 | Admitting: Internal Medicine

## 2011-05-17 ENCOUNTER — Encounter: Payer: Self-pay | Admitting: Internal Medicine

## 2011-05-17 VITALS — BP 110/70 | HR 60 | Temp 98.6°F | Resp 16 | Ht 70.0 in | Wt 168.0 lb

## 2011-05-17 DIAGNOSIS — E785 Hyperlipidemia, unspecified: Secondary | ICD-10-CM

## 2011-05-17 DIAGNOSIS — Z0000 Encounter for general adult medical examination without abnormal findings: Secondary | ICD-10-CM

## 2011-05-17 MED ORDER — ATORVASTATIN CALCIUM 10 MG PO TABS: 10.0000 mg | ORAL_TABLET | Freq: Every day | ORAL | Status: DC

## 2011-05-17 NOTE — Progress Notes (Signed)
  Subjective:    Patient ID: Julian Crawford, male    DOB: [birth year], 60 y.o.   MRN: [id number]  HPI This is a 60 year old white male who presents for followup of hyperlipidemia and for a annual examination. He is on triple therapy for his cholesterol taking omega-3 fatty acids niacin and Zocor.  He has not gained weight he states he is compliant with his medications but not as compliant with his diet having recently traveled to [place].  He is also seen for vitamin D deficiency and is on 2000 international units on a daily basis.  His cardiovascular risk factors her age hyperlipidemia he has no history of smoking      Review of Systems  Constitutional: Negative for fever and fatigue.  HENT: Negative for hearing loss, congestion, neck pain and postnasal drip.   Eyes: Negative for discharge, redness and visual disturbance.  Respiratory: Negative for cough, shortness of breath and wheezing.   Cardiovascular: Negative for leg swelling.  Gastrointestinal: Negative for abdominal pain, constipation and abdominal distention.  Genitourinary: Negative for urgency and frequency.  Musculoskeletal: Negative for joint swelling and arthralgias.  Skin: Negative for color change and rash.  Neurological: Negative for weakness and light-headedness.  Hematological: Negative for adenopathy.  Psychiatric/Behavioral: Negative for behavioral problems.   Past Medical History  Diagnosis Date  . Colon polyps   . Hyperlipidemia   . Heart murmur    Past Surgical History  Procedure Date  . Pilonidal cyst excision   . Tonsillectomy     reports that he has never smoked. He does not have any smokeless tobacco history on file. He reports that he drinks alcohol. He reports that he does not use illicit drugs. family history includes Alzheimer's disease in his father; Dementia in his father and mother; Heart disease in his father; and Hyperlipidemia in his mother. No Known Allergies      Objective:   Physical Exam  Nursing note and vitals reviewed. Constitutional: He appears well-developed and well-nourished.  HENT:  Head: Normocephalic and atraumatic.  Eyes: Conjunctivae are normal. Pupils are equal, round, and reactive to light.  Neck: Normal range of motion. Neck supple.  Cardiovascular: Normal rate and regular rhythm.   Pulmonary/Chest: Effort normal and breath sounds normal.  Abdominal: Soft. Bowel sounds are normal.      Patient presents for yearly preventative medicine examination.   all immunizations and health maintenance protocols were reviewed with the patient and they are up to date with these protocols.   screening laboratory values were reviewed with the patient including screening of hyperlipidemia PSA renal function and hepatic function.   There medications past medical history social history problem list and allergies were reviewed in detail.   Goals were established with regard to weight loss exercise diet in compliance with medications  His LDL cholesterol is slightly elevated and should be under 100 with the triple therapy he is taking is question whether or not a generic Zocor is as strong as the Prandin Zocor there is also a question of diet.  We reviewed his diet and felt to be adequate and we discussed changing from generic Zocor to Lipitor.  He will evaluate this cost wise and if he agrees we will change the generic Zocor to Lipitor 10 and monitor his cholesterol in 3 months.  Vitamin D level has stayed steady state on supplementation. The    Assessment & Plan:

## 2011-05-17 NOTE — Patient Instructions (Addendum)
The patient is instructed to continue all medications as prescribed. Schedule followup with check out clerk upon leaving the clinic Check on the price of generic Lipitor or atorvastatin

## 2011-09-14 ENCOUNTER — Other Ambulatory Visit (INDEPENDENT_AMBULATORY_CARE_PROVIDER_SITE_OTHER): Payer: 59

## 2011-09-14 DIAGNOSIS — E785 Hyperlipidemia, unspecified: Secondary | ICD-10-CM

## 2011-09-14 LAB — HEPATIC FUNCTION PANEL
AST: 25 U/L (ref 0–37)
Albumin: 4.3 g/dL (ref 3.5–5.2)

## 2011-09-14 LAB — LIPID PANEL
Cholesterol: 188 mg/dL (ref 0–200)
HDL: 46.5 mg/dL (ref >39.00)
VLDL: 20 mg/dL (ref 0.0–40.0)

## 2011-09-21 ENCOUNTER — Encounter: Payer: Self-pay | Admitting: Internal Medicine

## 2011-09-21 ENCOUNTER — Ambulatory Visit (INDEPENDENT_AMBULATORY_CARE_PROVIDER_SITE_OTHER): Payer: 59 | Admitting: Internal Medicine

## 2011-09-21 VITALS — BP 124/80 | HR 64 | Temp 98.3°F | Resp 16 | Ht 70.0 in | Wt 172.0 lb

## 2011-09-21 DIAGNOSIS — E785 Hyperlipidemia, unspecified: Secondary | ICD-10-CM

## 2011-09-21 DIAGNOSIS — Z0000 Encounter for general adult medical examination without abnormal findings: Secondary | ICD-10-CM

## 2011-09-21 MED ORDER — SIMVASTATIN 20 MG PO TABS: 20.0000 mg | ORAL_TABLET | Freq: Every day | ORAL | Status: DC

## 2011-09-21 NOTE — Progress Notes (Signed)
  Subjective:    Patient ID: Julian Crawford, male    DOB: 1951/05/29, 61 y.o.   MRN: [id number]  HPI Worsening lipids on lipitor   Review of Systems  Constitutional: Negative for fever and fatigue.  HENT: Negative for hearing loss, congestion, neck pain and postnasal drip.   Eyes: Negative for discharge, redness and visual disturbance.  Respiratory: Negative for cough, shortness of breath and wheezing.   Cardiovascular: Negative for leg swelling.  Gastrointestinal: Negative for abdominal pain, constipation and abdominal distention.  Genitourinary: Negative for urgency and frequency.  Musculoskeletal: Negative for joint swelling and arthralgias.  Skin: Negative for color change and rash.  Neurological: Negative for weakness and light-headedness.  Hematological: Negative for adenopathy.  Psychiatric/Behavioral: Negative for behavioral problems.   Past Medical History  Diagnosis Date  . Colon polyps   . Hyperlipidemia   . Heart murmur     History   Social History  . Marital Status: Single    Spouse Name: N/A    Number of Children: N/A  . Years of Education: N/A   Occupational History  . Not on file.   Social History Main Topics  . Smoking status: Never Smoker   . Smokeless tobacco: Not on file  . Alcohol Use: Yes  . Drug Use: No  . Sexually Active: Yes   Other Topics Concern  . Not on file   Social History Narrative  . No narrative on file    Past Surgical History  Procedure Date  . Pilonidal cyst excision   . Tonsillectomy     Family History  Problem Relation Age of Onset  . Hyperlipidemia Mother   . Dementia Mother   . Dementia Father   . Alzheimer's disease Father   . Heart disease Father     No Known Allergies  Current Outpatient Prescriptions on File Prior to Visit  Medication Sig Dispense Refill  . aspirin 81 MG tablet Take 81 mg by mouth daily.        . Cholecalciferol (VITAMIN D3) 2000 UNITS TABS Take by mouth daily.        . fish  oil-omega-3 fatty acids 1000 MG capsule Take 2 g by mouth 2 (two) times daily.        . multivitamin (THERAGRAN) per tablet Take 1 tablet by mouth daily.        . niacin (NIASPAN) 500 MG CR tablet Take 500 mg by mouth daily.        Marland Kitchen zolpidem (AMBIEN) 5 MG tablet Take 5 mg by mouth at bedtime as needed.          BP 124/80  Pulse 64  Temp 98.3 F (36.8 C)  Resp 16  Ht 5\' 10"  (1.778 m)  Wt 172 lb (78.019 kg)  BMI 24.68 kg/m2       Objective:   Physical Exam  Nursing note and vitals reviewed. Constitutional: He appears well-developed and well-nourished.  HENT:  Head: Normocephalic and atraumatic.  Eyes: Conjunctivae are normal. Pupils are equal, round, and reactive to light.  Neck: Normal range of motion. Neck supple.  Cardiovascular: Normal rate and regular rhythm.   Pulmonary/Chest: Effort normal and breath sounds normal.  Abdominal: Soft. Bowel sounds are normal.          Assessment & Plan:  Lipid management with change to zocor

## 2011-09-21 NOTE — Patient Instructions (Signed)
The patient is instructed to continue all medications as prescribed. Schedule followup with check out clerk upon leaving the clinic  

## 2011-09-23 ENCOUNTER — Ambulatory Visit: Payer: 59 | Admitting: Internal Medicine

## 2012-01-28 IMAGING — CR DG ABDOMEN 2V
2 series · 2 of 2 positions shown · non-contrast
Comparison: None.

CLINICAL DATA: Low abdominal pain and distention

ABDOMEN - 2 VIEW

[view not recorded (1 of 2)]
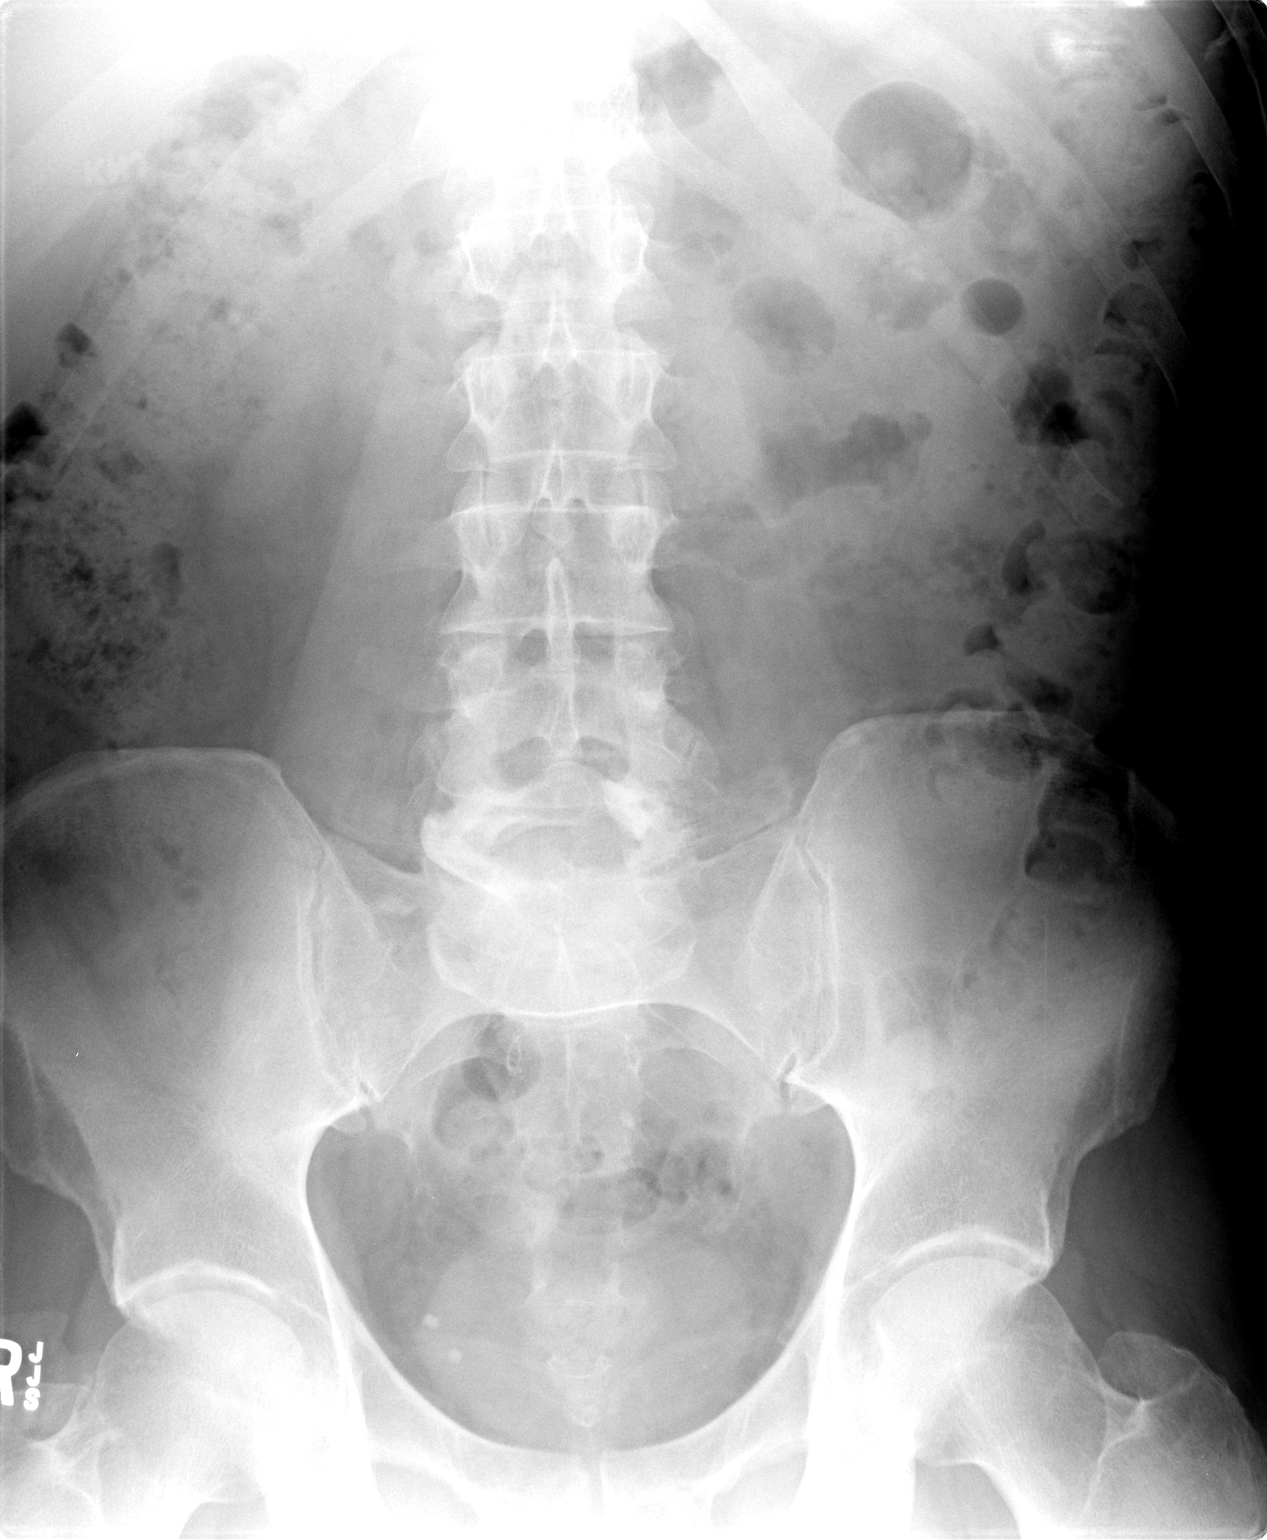

[view not recorded (2 of 2)]
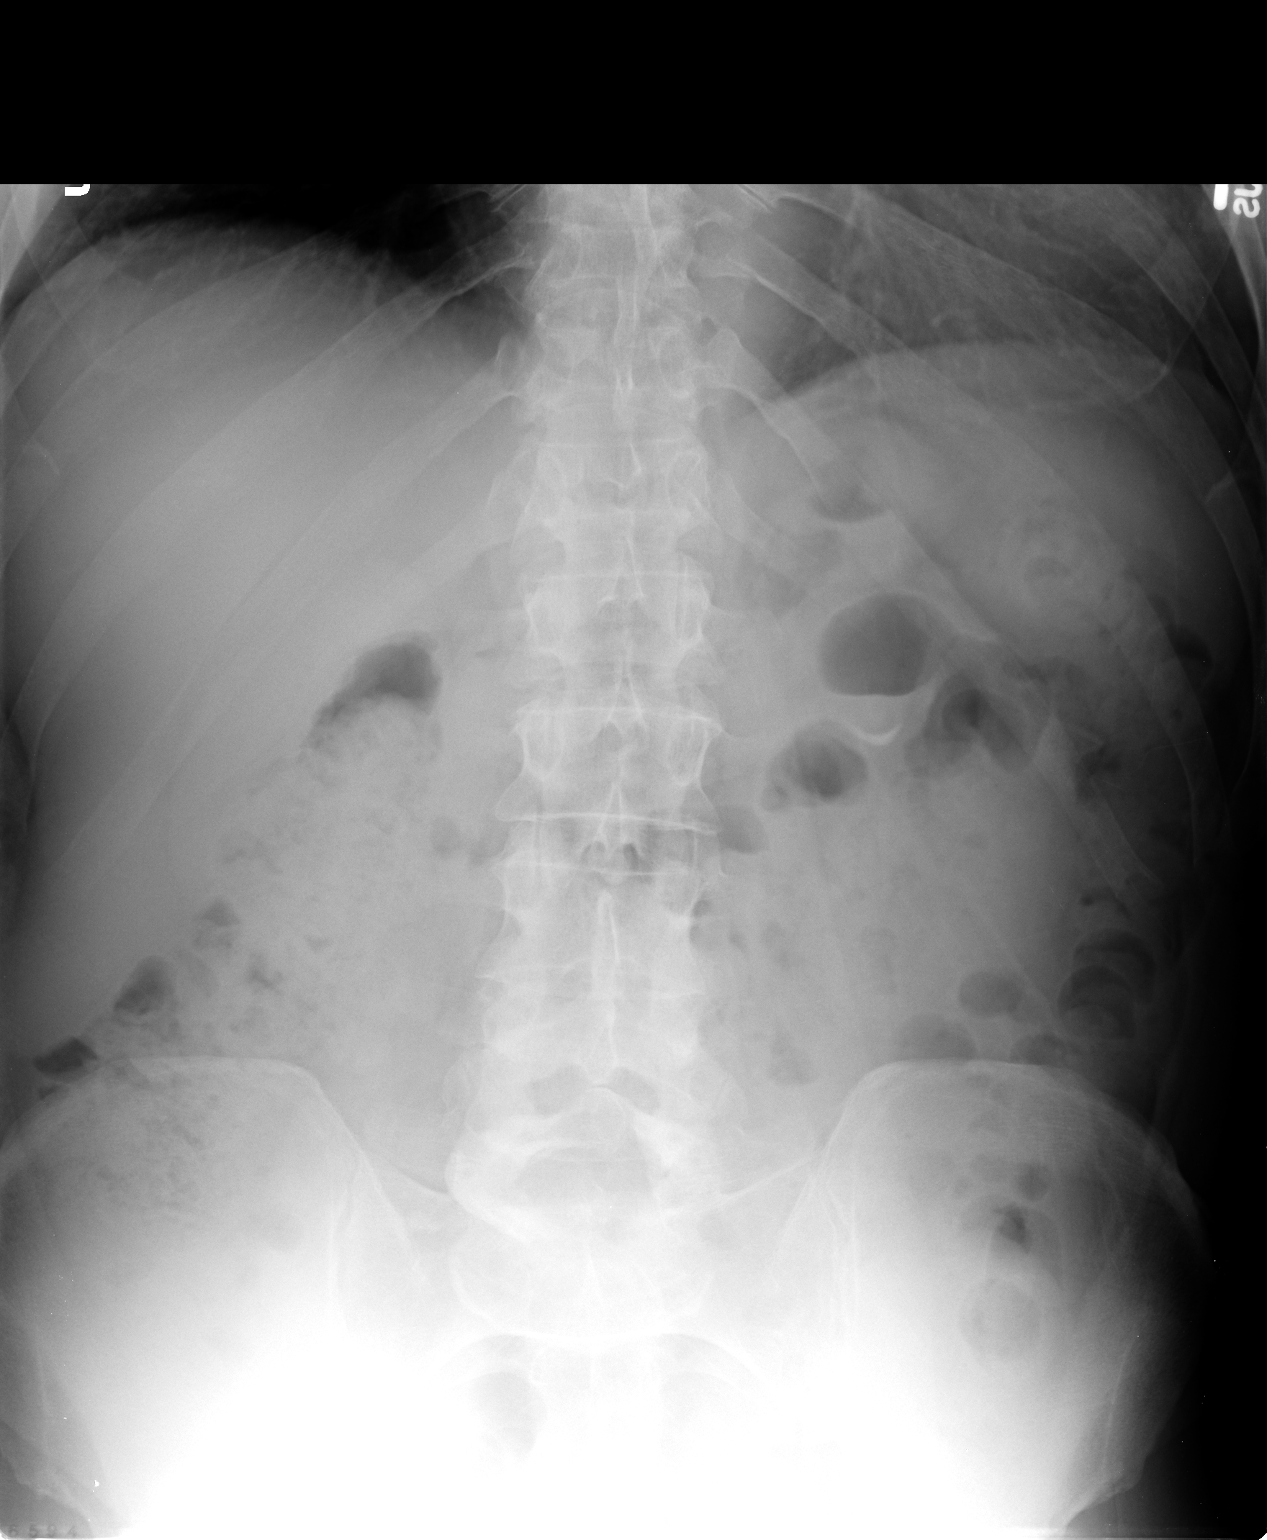

[2 of 2 positions shown; findings below may reference images not displayed]

FINDINGS: Supine and erect views of the abdomen show no bowel
obstruction.  No free air is seen on the erect view.  A moderate
amount of feces is noted throughout the colon.  No opaque calculi
are seen other than probable phleboliths low in the right bony
pelvis.  There are degenerative changes at the L5-S1 level.
IMPRESSION: No bowel obstruction.  No free air.

## 2012-05-10 ENCOUNTER — Other Ambulatory Visit (INDEPENDENT_AMBULATORY_CARE_PROVIDER_SITE_OTHER): Payer: 59

## 2012-05-10 DIAGNOSIS — Z0000 Encounter for general adult medical examination without abnormal findings: Secondary | ICD-10-CM

## 2012-05-10 LAB — POCT URINALYSIS DIPSTICK
Bilirubin, UA: NEGATIVE
Ketones, UA: NEGATIVE
Leukocytes, UA: NEGATIVE

## 2012-05-10 LAB — CBC WITH DIFFERENTIAL/PLATELET
Basophils Relative: 0.5 % (ref 0.0–3.0)
Eosinophils Relative: 3 % (ref 0.0–5.0)
Hemoglobin: 13.7 g/dL (ref 13.0–17.0)
Lymphocytes Relative: 26.2 % (ref 12.0–46.0)
Monocytes Relative: 7.7 % (ref 3.0–12.0)
Neutro Abs: 3.3 10*3/uL (ref 1.4–7.7)
RBC: 4.49 Mil/uL (ref 4.22–5.81)

## 2012-05-10 LAB — BASIC METABOLIC PANEL
BUN: 16 mg/dL (ref 6–23)
GFR: 90.59 mL/min (ref >60.00)
Glucose, Bld: 89 mg/dL (ref 70–99)
Potassium: 4 mEq/L (ref 3.5–5.1)

## 2012-05-10 LAB — HEPATIC FUNCTION PANEL
ALT: 29 U/L (ref 0–53)
AST: 30 U/L (ref 0–37)
Total Bilirubin: 1.1 mg/dL (ref 0.3–1.2)

## 2012-05-10 LAB — LIPID PANEL
Cholesterol: 181 mg/dL (ref 0–200)
LDL Cholesterol: 123 mg/dL — ABNORMAL HIGH (ref 0–99)
Total CHOL/HDL Ratio: 5

## 2012-05-17 ENCOUNTER — Ambulatory Visit (INDEPENDENT_AMBULATORY_CARE_PROVIDER_SITE_OTHER): Payer: 59 | Admitting: Internal Medicine

## 2012-05-17 ENCOUNTER — Encounter: Payer: Self-pay | Admitting: Internal Medicine

## 2012-05-17 VITALS — BP 124/80 | HR 72 | Temp 98.3°F | Resp 16 | Ht 70.0 in | Wt 169.0 lb

## 2012-05-17 DIAGNOSIS — Z0000 Encounter for general adult medical examination without abnormal findings: Secondary | ICD-10-CM

## 2012-05-17 DIAGNOSIS — E785 Hyperlipidemia, unspecified: Secondary | ICD-10-CM

## 2012-05-17 DIAGNOSIS — H10829 Rosacea conjunctivitis, unspecified eye: Secondary | ICD-10-CM

## 2012-05-17 MED ORDER — EQ PROBIOTIC PO CAPS: 1.0000 | ORAL_CAPSULE | Freq: Every day | ORAL | Status: DC

## 2012-05-17 NOTE — Progress Notes (Signed)
Subjective:    Patient ID: Julian Crawford, male    DOB: [birth year], 61 y.o.   MRN: [id number]  HPI  CPX  Reviewed labs reviewed opthamology  visit  Review of Systems  Constitutional: Negative for fever and fatigue.  HENT: Negative for hearing loss, congestion, neck pain and postnasal drip.   Eyes: Negative for discharge, redness and visual disturbance.  Respiratory: Negative for cough, shortness of breath and wheezing.   Cardiovascular: Negative for leg swelling.  Gastrointestinal: Negative for abdominal pain, constipation and abdominal distention.  Genitourinary: Negative for urgency and frequency.  Musculoskeletal: Negative for joint swelling and arthralgias.  Skin: Negative for color change and rash.  Neurological: Negative for weakness and light-headedness.  Hematological: Negative for adenopathy.  Psychiatric/Behavioral: Negative for behavioral problems.   Past Medical History  Diagnosis Date  . Colon polyps   . Hyperlipidemia   . Heart murmur     History   Social History  . Marital Status: Single    Spouse Name: N/A    Number of Children: N/A  . Years of Education: N/A   Occupational History  . Not on file.   Social History Main Topics  . Smoking status: Never Smoker   . Smokeless tobacco: Not on file  . Alcohol Use: Yes  . Drug Use: No  . Sexually Active: Yes   Other Topics Concern  . Not on file   Social History Narrative  . No narrative on file    Past Surgical History  Procedure Date  . Pilonidal cyst excision   . Tonsillectomy     Family History  Problem Relation Age of Onset  . Hyperlipidemia Mother   . Dementia Mother   . Dementia Father   . Alzheimer's disease Father   . Heart disease Father     No Known Allergies  Current Outpatient Prescriptions on File Prior to Visit  Medication Sig Dispense Refill  . aspirin 81 MG tablet Take 81 mg by mouth daily.        . Cholecalciferol (VITAMIN D3) 2000 UNITS TABS Take by mouth daily.         . fish oil-omega-3 fatty acids 1000 MG capsule Take 2 g by mouth 2 (two) times daily.        . multivitamin (THERAGRAN) per tablet Take 1 tablet by mouth daily.        . niacin (NIASPAN) 500 MG CR tablet Take 500 mg by mouth daily.        . simvastatin (ZOCOR) 20 MG tablet Take 1 tablet (20 mg total) by mouth at bedtime.  90 tablet  3  . zolpidem (AMBIEN) 5 MG tablet Take 5 mg by mouth at bedtime as needed.        . [DISCONTINUED] atorvastatin (LIPITOR) 10 MG tablet Take 1 tablet (10 mg total) by mouth daily.  30 tablet  11    BP 124/80  Pulse 72  Temp 98.3 F (36.8 C)  Resp 16  Ht 5\' 10"  (1.778 m)  Wt 169 lb (76.658 kg)  BMI 24.25 kg/m2       Objective:   Physical Exam  Nursing note and vitals reviewed. Constitutional: He is oriented to person, place, and time. He appears well-developed and well-nourished.  HENT:  Head: Normocephalic and atraumatic.  Eyes: Conjunctivae normal are normal. Pupils are equal, round, and reactive to light.  Neck: Normal range of motion. Neck supple.  Cardiovascular: Normal rate and regular rhythm.   Pulmonary/Chest: Effort normal and breath  sounds normal.  Abdominal: Soft. Bowel sounds are normal.  Genitourinary: Rectum normal and prostate normal.  Musculoskeletal: Normal range of motion. He exhibits tenderness.  Neurological: He is alert and oriented to person, place, and time.  Skin: Skin is warm and dry.  Psychiatric: He has a normal mood and affect. His behavior is normal.          Assessment & Plan:   Patient presents for yearly preventative medicine examination.   all immunizations and health maintenance protocols were reviewed with the patient and they are up to date with these protocols.   screening laboratory values were reviewed with the patient including screening of hyperlipidemia PSA renal function and hepatic function.   There medications past medical history social history problem list and allergies were reviewed in  detail.   Goals were established with regard to weight loss exercise diet in compliance with medications

## 2012-05-17 NOTE — Patient Instructions (Signed)
The patient is instructed to continue all medications as prescribed. Schedule followup with check out clerk upon leaving the clinic  

## 2012-08-18 ENCOUNTER — Other Ambulatory Visit: Payer: Self-pay

## 2012-10-15 ENCOUNTER — Other Ambulatory Visit: Payer: Self-pay | Admitting: Internal Medicine

## 2012-11-09 ENCOUNTER — Other Ambulatory Visit (INDEPENDENT_AMBULATORY_CARE_PROVIDER_SITE_OTHER): Payer: 59

## 2012-11-09 DIAGNOSIS — E785 Hyperlipidemia, unspecified: Secondary | ICD-10-CM

## 2012-11-09 LAB — HEPATIC FUNCTION PANEL
ALT: 26 U/L (ref 0–53)
AST: 24 U/L (ref 0–37)
Albumin: 4.3 g/dL (ref 3.5–5.2)
Total Protein: 6.9 g/dL (ref 6.0–8.3)

## 2012-11-09 LAB — LIPID PANEL: Cholesterol: 171 mg/dL (ref 0–200)

## 2012-11-16 ENCOUNTER — Ambulatory Visit (INDEPENDENT_AMBULATORY_CARE_PROVIDER_SITE_OTHER): Payer: 59 | Admitting: Internal Medicine

## 2012-11-16 ENCOUNTER — Encounter: Payer: Self-pay | Admitting: Internal Medicine

## 2012-11-16 VITALS — BP 110/76 | HR 72 | Temp 97.7°F | Resp 16 | Ht 70.0 in | Wt 170.0 lb

## 2012-11-16 DIAGNOSIS — M67919 Unspecified disorder of synovium and tendon, unspecified shoulder: Secondary | ICD-10-CM

## 2012-11-16 DIAGNOSIS — D485 Neoplasm of uncertain behavior of skin: Secondary | ICD-10-CM

## 2012-11-16 DIAGNOSIS — L858 Other specified epidermal thickening: Secondary | ICD-10-CM

## 2012-11-16 DIAGNOSIS — M7552 Bursitis of left shoulder: Secondary | ICD-10-CM

## 2012-11-16 DIAGNOSIS — E785 Hyperlipidemia, unspecified: Secondary | ICD-10-CM

## 2012-11-16 MED ORDER — METHYLPREDNISOLONE ACETATE 40 MG/ML IJ SUSP: 40.0000 mg | Freq: Once | INTRAMUSCULAR | Status: DC

## 2012-11-16 NOTE — Assessment & Plan Note (Signed)
Patient presents for followup of hyperlipidemia on simvastatin. His functions are completely normal total cholesterol is 171 HDL is 41.9 and LDL is 116 to be all right ear for good control of his lipids on simvastatin

## 2012-11-16 NOTE — Patient Instructions (Signed)
The patient is instructed to continue all medications as prescribed. Schedule followup with check out clerk upon leaving the clinic  

## 2012-11-16 NOTE — Progress Notes (Signed)
Subjective:    Patient ID: Julian Crawford, male    DOB: 06/13/1951, 62 y.o.   MRN: [id number]  Hyperlipidemia This is a chronic problem. The current episode started more than 1 year ago. The problem is controlled. Recent lipid tests were reviewed and are normal. He has no history of chronic renal disease, diabetes, hypothyroidism, liver disease, obesity or nephrotic syndrome. There are no known factors aggravating his hyperlipidemia. Pertinent negatives include no chest pain, focal sensory loss, focal weakness, leg pain, myalgias or shortness of breath. Current antihyperlipidemic treatment includes statins. The current treatment provides mild improvement of lipids. Compliance problems include medication side effects.  Risk factors for coronary artery disease include dyslipidemia and male sex.    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  HENT: Negative for hearing loss, congestion, neck pain and postnasal drip.   Eyes: Negative for discharge, redness and visual disturbance.  Respiratory: Negative for cough, shortness of breath and wheezing.   Cardiovascular: Negative for chest pain and leg swelling.  Gastrointestinal: Negative for abdominal pain, constipation and abdominal distention.  Genitourinary: Negative for urgency and frequency.  Musculoskeletal: Negative for myalgias, joint swelling and arthralgias.  Skin: Negative for color change and rash.  Neurological: Negative for focal weakness, weakness and light-headedness.  Hematological: Negative for adenopathy.  Psychiatric/Behavioral: Negative for behavioral problems.   Past Medical History  Diagnosis Date  . Colon polyps   . Hyperlipidemia   . Heart murmur     History   Social History  . Marital Status: Single    Spouse Name: N/A    Number of Children: N/A  . Years of Education: N/A   Occupational History  . Not on file.   Social History  Main Topics  . Smoking status: Never Smoker   . Smokeless tobacco: Not on file  . Alcohol Use: Yes  . Drug Use: No  . Sexually Active: Yes   Other Topics Concern  . Not on file   Social History Narrative  . No narrative on file    Past Surgical History  Procedure Laterality Date  . Pilonidal cyst excision    . Tonsillectomy      Family History  Problem Relation Age of Onset  . Hyperlipidemia Mother   . Dementia Mother   . Dementia Father   . Alzheimer's disease Father   . Heart disease Father     No Known Allergies  Current Outpatient Prescriptions on File Prior to Visit  Medication Sig Dispense Refill  . aspirin 81 MG tablet Take 81 mg by mouth daily.        . Cholecalciferol (VITAMIN D3) 2000 UNITS TABS Take by mouth daily.        . fish oil-omega-3 fatty acids 1000 MG capsule Take 2 g by mouth 2 (two) times daily.        . multivitamin (THERAGRAN) per tablet Take 1 tablet by mouth daily.        . niacin (NIASPAN) 500 MG CR tablet Take 500 mg by mouth daily.        . Probiotic Product (EQ PROBIOTIC) CAPS Take 1 capsule by mouth daily.      . simvastatin (ZOCOR) 20 MG tablet TAKE 1 TABLET BY MOUTH ATBEDTIME.  90 tablet  3  . zolpidem (AMBIEN) 5 MG tablet Take 5 mg by mouth at bedtime as needed.        . [DISCONTINUED] atorvastatin (LIPITOR)  10 MG tablet Take 1 tablet (10 mg total) by mouth daily.  30 tablet  11   No current facility-administered medications on file prior to visit.    BP 110/76  Pulse 72  Temp(Src) 97.7 F (36.5 C)  Resp 16  Ht 5\' 10"  (1.778 m)  Wt 170 lb (77.111 kg)  BMI 24.39 kg/m2       Objective:   Physical Exam  Nursing note and vitals reviewed. Constitutional: He appears well-developed and well-nourished.  HENT:  Head: Normocephalic and atraumatic.  Eyes: Conjunctivae are normal. Pupils are equal, round, and reactive to light.  Neck: Normal range of motion. Neck supple.  Cardiovascular: Normal rate and regular rhythm.    Pulmonary/Chest: Effort normal and breath sounds normal.          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 Local anesthesia was obtained with topical spray. Then 40 mg of Depo-Medrol and 1/2 cc of lidocaine was injected into the joint space. The patient tolerated the procedure without complications. Post injection care discussed with patient.

## 2013-04-10 ENCOUNTER — Ambulatory Visit (INDEPENDENT_AMBULATORY_CARE_PROVIDER_SITE_OTHER): Payer: 59

## 2013-04-10 DIAGNOSIS — Z23 Encounter for immunization: Secondary | ICD-10-CM

## 2013-05-27 ENCOUNTER — Other Ambulatory Visit (INDEPENDENT_AMBULATORY_CARE_PROVIDER_SITE_OTHER): Payer: 59

## 2013-05-27 DIAGNOSIS — Z0000 Encounter for general adult medical examination without abnormal findings: Secondary | ICD-10-CM

## 2013-05-27 LAB — CBC WITH DIFFERENTIAL/PLATELET
Basophils Absolute: 0.1 10*3/uL (ref 0.0–0.1)
Eosinophils Relative: 4.3 % (ref 0.0–5.0)
HCT: 42.5 % (ref 39.0–52.0)
Lymphs Abs: 2.1 10*3/uL (ref 0.7–4.0)
MCV: 90.8 fl (ref 78.0–100.0)
Monocytes Absolute: 0.5 10*3/uL (ref 0.1–1.0)
Neutro Abs: 3.8 10*3/uL (ref 1.4–7.7)
Platelets: 266 10*3/uL (ref 150.0–400.0)
RDW: 11.9 % (ref 11.5–14.6)

## 2013-05-27 LAB — HEPATIC FUNCTION PANEL
ALT: 25 U/L (ref 0–53)
Albumin: 4.3 g/dL (ref 3.5–5.2)
Total Bilirubin: 0.9 mg/dL (ref 0.3–1.2)

## 2013-05-27 LAB — POCT URINALYSIS DIPSTICK
Blood, UA: NEGATIVE
Glucose, UA: NEGATIVE
Nitrite, UA: NEGATIVE
Protein, UA: NEGATIVE
Urobilinogen, UA: 0.2
pH, UA: 6

## 2013-05-27 LAB — BASIC METABOLIC PANEL
BUN: 14 mg/dL (ref 6–23)
Chloride: 104 mEq/L (ref 96–112)
Glucose, Bld: 91 mg/dL (ref 70–99)
Potassium: 3.7 mEq/L (ref 3.5–5.1)

## 2013-05-27 LAB — LDL CHOLESTEROL, DIRECT: Direct LDL: 146.5 mg/dL

## 2013-05-27 LAB — LIPID PANEL
Total CHOL/HDL Ratio: 5
Triglycerides: 115 mg/dL (ref 0.0–149.0)

## 2013-05-27 LAB — PSA: PSA: 2.07 ng/mL (ref 0.10–4.00)

## 2013-05-27 LAB — TSH: TSH: 6.26 u[IU]/mL — ABNORMAL HIGH (ref 0.35–5.50)

## 2013-06-03 ENCOUNTER — Encounter: Payer: 59 | Admitting: Internal Medicine

## 2013-06-10 ENCOUNTER — Ambulatory Visit (INDEPENDENT_AMBULATORY_CARE_PROVIDER_SITE_OTHER): Payer: 59 | Admitting: Internal Medicine

## 2013-06-10 ENCOUNTER — Encounter: Payer: Self-pay | Admitting: Internal Medicine

## 2013-06-10 VITALS — BP 120/70 | HR 72 | Temp 98.2°F | Resp 16 | Ht 70.0 in | Wt 168.0 lb

## 2013-06-10 DIAGNOSIS — R972 Elevated prostate specific antigen [PSA]: Secondary | ICD-10-CM

## 2013-06-10 DIAGNOSIS — Z0000 Encounter for general adult medical examination without abnormal findings: Secondary | ICD-10-CM

## 2013-06-10 DIAGNOSIS — E039 Hypothyroidism, unspecified: Secondary | ICD-10-CM

## 2013-06-10 DIAGNOSIS — N419 Inflammatory disease of prostate, unspecified: Secondary | ICD-10-CM

## 2013-06-10 LAB — T4, FREE: Free T4: 0.75 ng/dL (ref 0.60–1.60)

## 2013-06-10 LAB — TSH: TSH: 2.87 u[IU]/mL (ref 0.35–5.50)

## 2013-06-10 MED ORDER — CIPROFLOXACIN HCL 500 MG PO TABS: 500.0000 mg | ORAL_TABLET | Freq: Two times a day (BID) | ORAL | Status: DC

## 2013-06-10 NOTE — Patient Instructions (Addendum)
The patient is instructed to continue all medications as prescribed. Schedule followup with check out clerk upon leaving the clinic   Make an appointment for the PSA check and thyroid check in 6 weeksProstatitis The prostate gland is about the size and shape of a walnut. It is located just below your bladder. It produces one of the components of semen, which is made up of sperm and the fluids that help nourish and transport it out from the testicles. Prostatitis is redness, soreness, and swelling (inflammation) of the prostate gland.  There are 3 types of prostatitis:  Acute bacterial prostatitis This is the least common type of prostatitis. It starts quickly and usually leads to a bladder infection. It can occur at any age.  Chronic bacterial prostatitis This is a persistent bacterial infection in the prostate. It usually develops from repeated acute bacterial prostatitis or acute bacterial prostatitis that was not properly treated. It can occur in men of any age but is most common in middle-aged men whose prostate has begun to enlarge.   Chronic prostatitis chronic pelvic pain syndrome This is the most common type of prostatitis. It is inflammation of the prostate gland that is not caused by a bacterial infection. The cause is unknown. CAUSES The cause of acute and chronic bacterial prostatitis is a bacterial infection. The exact cause of chronic prostatitis and chronic pelvic pain syndrome and asymptomatic inflammatory prostatitis is unknown.  SYMPTOMS  Symptoms can vary depending upon the type of prostatitis that exists. There can also be overlap in symptoms. Possible symptoms for each type of prostatitis are listed below. Acute bacterial prostatitis  Painful urination.  Fever or chills.  Muscle or joint pains.  Low back pain.  Low abdominal pain.  Inability to empty bladder completely.  Sudden urge to urinate.  Frequent urination.  Difficulty starting urine stream.  Weak  urine stream.  Discharge from the urethra.  Dribbling after urination.  Rectal pain.  Pain in the testicles, penis, or tip of the penis.  Pain in the space between the anus and scrotum (perineum).  Problems with sexual function.  Painful ejaculation.  Bloody semen. Chronic bacterial prostatitis  The symptoms are similar to those of acute bacterial prostatitis, but they usually are much less severe. Fever, chills, and muscle and joint pain are not associated with chronic bacterial prostatitis. Chronic prostatitis chronic pelvic pain syndrome  Symptoms typically include a dull ache in the scrotum and the perineum. DIAGNOSIS  In order to diagnose prostatitis, your caregiver will ask about your symptoms. If acute or chronic bacterial prostatitis is suspected, a urine sample will be taken and tested (urinalysis). This is to see if there is bacteria in your urine. If the urinalysis result is negative for bacteria, your caregiver may use a finger to feel your prostate (digital rectal exam). This exam helps your caregiver determine if your prostate is swollen and tender. TREATMENT  Treatment for prostatitis depends on the cause. If a bacterial infection is the cause, it can be treated with antibiotic medicine. In cases of chronic bacterial prostatitis, the use of antibiotics for up to 1 month may be necessary. Your caregiver may instruct you to take sitz baths to help relieve pain. A sitz bath is a bath of hot water in which your hips and buttocks are under water. HOME CARE INSTRUCTIONS   Take all medicines as directed by your caregiver.  Take sitz baths as directed by your caregiver. SEEK MEDICAL CARE IF:   Your symptoms get worse,  not better.  You have a fever. SEEK IMMEDIATE MEDICAL CARE IF:   You have chills.  You feel nauseous or vomit.  You feel lightheaded or faint.  You are unable to urinate.  You have blood or blood clots in your urine. Document Released: 06/17/2000  Document Revised: 09/12/2011 Document Reviewed: 01/07/2013 [facility] Patient Information 2014 [address], [place].

## 2013-06-10 NOTE — Progress Notes (Signed)
   Subjective:    Patient ID: Julian Crawford, male    DOB: Dec 21, 1950, 62 y.o.   MRN: [id number]  HPI CPX     Review of Systems  Constitutional: Negative for fever and fatigue.  HENT: Negative for congestion, hearing loss and postnasal drip.   Eyes: Negative for discharge, redness and visual disturbance.  Respiratory: Negative for cough, shortness of breath and wheezing.   Cardiovascular: Negative for leg swelling.  Gastrointestinal: Negative for abdominal pain, constipation and abdominal distention.  Genitourinary: Negative for urgency and frequency.  Musculoskeletal: Negative for arthralgias, joint swelling and neck pain.  Skin: Negative for color change and rash.  Neurological: Negative for weakness and light-headedness.  Hematological: Negative for adenopathy.  Psychiatric/Behavioral: Negative for behavioral problems.       Objective:   Physical Exam  Constitutional: He is oriented to person, place, and time. He appears well-developed and well-nourished.  HENT:  Head: Normocephalic and atraumatic.  Eyes: Conjunctivae are normal. Pupils are equal, round, and reactive to light.  Neck: Normal range of motion. Neck supple.  Cardiovascular: Normal rate and regular rhythm.   Pulmonary/Chest: Effort normal and breath sounds normal.  Abdominal: Soft. Bowel sounds are normal.  Genitourinary: Rectum normal.  Musculoskeletal: Normal range of motion.  Neurological: He is alert and oriented to person, place, and time.  Skin: Skin is warm and dry.  Psychiatric: He has a normal mood and affect. His behavior is normal.          Assessment & Plan:   Patient presents for yearly preventative medicine examination.   all immunizations and health maintenance protocols were reviewed with the patient and they are up to date with these protocols.   screening laboratory values were reviewed with the patient including screening of hyperlipidemia PSA renal function and hepatic  function.   There medications past medical history social history problem list and allergies were reviewed in detail.   Goals were established with regard to weight loss exercise diet in compliance with medications

## 2013-06-10 NOTE — Progress Notes (Signed)
Pre visit review using our clinic review tool, if applicable. No additional management support is needed unless otherwise documented below in the visit note. 

## 2013-06-11 LAB — T3, FREE: T3, Free: 2.8 pg/mL (ref 2.3–4.2)

## 2013-07-22 ENCOUNTER — Other Ambulatory Visit (INDEPENDENT_AMBULATORY_CARE_PROVIDER_SITE_OTHER): Payer: 59

## 2013-07-22 DIAGNOSIS — R972 Elevated prostate specific antigen [PSA]: Secondary | ICD-10-CM

## 2013-07-22 DIAGNOSIS — E039 Hypothyroidism, unspecified: Secondary | ICD-10-CM

## 2013-07-22 LAB — TSH: TSH: 3.62 u[IU]/mL (ref 0.35–5.50)

## 2013-07-23 LAB — PSA, TOTAL AND FREE
PSA FREE PCT: 24 % (ref >25)
PSA FREE: 0.25 ng/mL
PSA: 1.06 ng/mL (ref <=4.00)

## 2013-10-21 ENCOUNTER — Other Ambulatory Visit: Payer: Self-pay | Admitting: Internal Medicine

## 2014-03-12 ENCOUNTER — Encounter: Payer: Self-pay | Admitting: Gastroenterology

## 2014-03-28 ENCOUNTER — Ambulatory Visit (INDEPENDENT_AMBULATORY_CARE_PROVIDER_SITE_OTHER): Payer: 59

## 2014-03-28 DIAGNOSIS — Z23 Encounter for immunization: Secondary | ICD-10-CM

## 2015-08-05 ENCOUNTER — Encounter: Payer: Self-pay | Admitting: Gastroenterology

## 2016-01-29 ENCOUNTER — Encounter: Payer: Self-pay | Admitting: Gastroenterology

## 2016-03-24 ENCOUNTER — Ambulatory Visit (AMBULATORY_SURGERY_CENTER): Payer: Self-pay

## 2016-03-24 VITALS — Ht 70.0 in | Wt 171.6 lb

## 2016-03-24 DIAGNOSIS — Z860100 Personal history of colon polyps, unspecified: Secondary | ICD-10-CM

## 2016-03-24 MED ORDER — SUPREP BOWEL PREP KIT 17.5-3.13-1.6 GM/177ML PO SOLN: 1.0000 | Freq: Once | ORAL | 0 refills | Status: AC

## 2016-03-24 NOTE — Addendum Note (Signed)
Addended by: Thurston Pounds F on: 03/24/2016 03:53 PM   Modules accepted: Orders

## 2016-03-24 NOTE — Progress Notes (Signed)
No allergies to eggs or soy No past problems with anesthesia No diet meds No home oxygen  Declined emmi 

## 2016-03-25 ENCOUNTER — Encounter: Payer: Self-pay | Admitting: Gastroenterology

## 2016-04-05 ENCOUNTER — Encounter: Payer: Self-pay | Admitting: Gastroenterology

## 2016-04-05 ENCOUNTER — Ambulatory Visit (AMBULATORY_SURGERY_CENTER): Payer: Commercial Managed Care - HMO | Admitting: Gastroenterology

## 2016-04-05 VITALS — BP 102/66 | HR 55 | Temp 97.7°F | Resp 16 | Ht 70.0 in | Wt 171.0 lb

## 2016-04-05 DIAGNOSIS — D120 Benign neoplasm of cecum: Secondary | ICD-10-CM | POA: Diagnosis not present

## 2016-04-05 DIAGNOSIS — Z86010 Personal history of colonic polyps: Secondary | ICD-10-CM | POA: Diagnosis not present

## 2016-04-05 MED ORDER — SODIUM CHLORIDE 0.9 % IV SOLN: 500.0000 mL | INTRAVENOUS | Status: DC

## 2016-04-05 NOTE — Progress Notes (Signed)
A and O x3. Report to RN. Tolerated MAC anesthesia well. 

## 2016-04-05 NOTE — Patient Instructions (Signed)
HANDOUTS GIVEN FOR POLYPS, HEMORRHOIDS, DIVERTICULOSIS    YOU HAD AN ENDOSCOPIC PROCEDURE TODAY AT [facility]:   Refer to the procedure report that was given to you for any specific questions about what was found during the examination.  If the procedure report does not answer your questions, please call your gastroenterologist to clarify.  If you requested that your care partner not be given the details of your procedure findings, then the procedure report has been included in a sealed envelope for you to review at your convenience later.  YOU SHOULD EXPECT: Some feelings of bloating in the abdomen. Passage of more gas than usual.  Walking can help get rid of the air that was put into your GI tract during the procedure and reduce the bloating. If you had a lower endoscopy (such as a colonoscopy or flexible sigmoidoscopy) you may notice spotting of blood in your stool or on the toilet paper. If you underwent a bowel prep for your procedure, you may not have a normal bowel movement for a few days.  Please Note:  You might notice some irritation and congestion in your nose or some drainage.  This is from the oxygen used during your procedure.  There is no need for concern and it should clear up in a day or so.  SYMPTOMS TO REPORT IMMEDIATELY:   Following lower endoscopy (colonoscopy or flexible sigmoidoscopy):  Excessive amounts of blood in the stool  Significant tenderness or worsening of abdominal pains  Swelling of the abdomen that is new, acute  Fever of 100F or higher   For urgent or emergent issues, a gastroenterologist can be reached at any hour by calling [phone].   DIET:  We do recommend a small meal at first, but then you may proceed to your regular diet.  Drink plenty of fluids but you should avoid alcoholic beverages for 24 hours.  ACTIVITY:  You should plan to take it easy for the rest of today and you should NOT DRIVE or use heavy machinery until  tomorrow (because of the sedation medicines used during the test).    FOLLOW UP: Our staff will call the number listed on your records the next business day following your procedure to check on you and address any questions or concerns that you may have regarding the information given to you following your procedure. If we do not reach you, we will leave a message.  However, if you are feeling well and you are not experiencing any problems, there is no need to return our call.  We will assume that you have returned to your regular daily activities without incident.  If any biopsies were taken you will be contacted by phone or by letter within the next 1-3 weeks.  Please call us at [phone] if you have not heard about the biopsies in 3 weeks.    SIGNATURES/CONFIDENTIALITY: You and/or your care partner have signed paperwork which will be entered into your electronic medical record.  These signatures attest to the fact that that the information above on your After Visit Summary has been reviewed and is understood.  Full responsibility of the confidentiality of this discharge information lies with you and/or your care-partner.

## 2016-04-05 NOTE — Op Note (Signed)
[place] Patient Name: Julian Crawford Procedure Date: 04/05/2016 11:08 AM MRN: [id number] Endoscopist: Ladene Artist , MD Age: 65 Referring MD:  Date of Birth: 1951/04/13 Gender: Male Account #: [id number] Procedure:                Colonoscopy Indications:              Surveillance: Personal history of adenomatous                            polyps on last colonoscopy > 5 years ago Medicines:                Monitored Anesthesia Care Procedure:                Pre-Anesthesia Assessment:                           - Prior to the procedure, a History and Physical                            was performed, and patient medications and                            allergies were reviewed. The patient's tolerance of                            previous anesthesia was also reviewed. The risks                            and benefits of the procedure and the sedation                            options and risks were discussed with the patient.                            All questions were answered, and informed consent                            was obtained. Prior Anticoagulants: The patient has                            taken no previous anticoagulant or antiplatelet                            agents. ASA Grade Assessment: II - A patient with                            mild systemic disease. After reviewing the risks                            and benefits, the patient was deemed in                            satisfactory condition to undergo the procedure.  After obtaining informed consent, the colonoscope                            was passed under direct vision. Throughout the                            procedure, the patient's blood pressure, pulse, and                            oxygen saturations were monitored continuously. The                            Model PCF-H190L [id number]) scope was introduced                            through the anus and  advanced to the the cecum,                            identified by appendiceal orifice and ileocecal                            valve. The ileocecal valve, appendiceal orifice,                            and rectum were photographed. The quality of the                            bowel preparation was good. The colonoscopy was                            performed without difficulty. The patient tolerated                            the procedure well. Scope In: 11:21:28 AM Scope Out: 11:33:56 AM Scope Withdrawal Time: 0 hours 10 minutes 4 seconds  Total Procedure Duration: 0 hours 12 minutes 28 seconds  Findings:                 The perianal and digital rectal examinations were                            normal.                           A 7 mm polyp was found in the cecum. The polyp was                            sessile. The polyp was removed with a cold snare.                            Resection and retrieval were complete.                           Non-bleeding internal hemorrhoids were found during  retroflexion. The hemorrhoids were small and Grade                            I (internal hemorrhoids that do not prolapse).                           Multiple small-mouthed diverticula were found in                            the sigmoid colon and descending colon. There was                            no evidence of diverticular bleeding.                           The exam was otherwise without abnormality on                            direct and retroflexion views. Complications:            No immediate complications. Estimated blood loss:                            None. Estimated Blood Loss:     Estimated blood loss: none. Impression:               - One 7 mm polyp in the cecum, removed with a cold                            snare. Resected and retrieved.                           - Non-bleeding internal hemorrhoids.                           - Moderate  diverticulosis in the sigmoid colon and                            in the descending colon.                           - The examination was otherwise normal on direct                            and retroflexion views. Recommendation:           - Repeat colonoscopy in 5 years for surveillance.                           - Patient has a contact number available for                            emergencies. The signs and symptoms of potential                            delayed complications were discussed with the  patient. Return to normal activities tomorrow.                            Written discharge instructions were provided to the                            patient.                           - High fiber diet.                           - Continue present medications.                           - Await pathology results. Ladene Artist, MD 04/05/2016 11:37:06 AM This report has been signed electronically.

## 2016-04-06 ENCOUNTER — Telehealth: Payer: Self-pay | Admitting: *Deleted

## 2016-04-06 NOTE — Telephone Encounter (Signed)
  Follow up Call-  Call back number 04/05/2016  Post procedure Call Back phone  # [phone]  Permission to leave phone message Yes  Some recent data might be hidden     Patient questions:  Do you have a fever, pain , or abdominal swelling? No. Pain Score  0 *  Have you tolerated food without any problems? Yes.    Have you been able to return to your normal activities? Yes.    Do you have any questions about your discharge instructions: Diet   No. Medications  No. Follow up visit  No.  Do you have questions or concerns about your Care? No.  Actions: * If pain score is 4 or above: No action needed, pain <4.

## 2016-04-18 ENCOUNTER — Encounter: Payer: Self-pay | Admitting: Gastroenterology

## 2016-07-15 DIAGNOSIS — Z0100 Encounter for examination of eyes and vision without abnormal findings: Secondary | ICD-10-CM | POA: Diagnosis not present

## 2016-08-09 DIAGNOSIS — L821 Other seborrheic keratosis: Secondary | ICD-10-CM | POA: Diagnosis not present

## 2016-08-09 DIAGNOSIS — D2371 Other benign neoplasm of skin of right lower limb, including hip: Secondary | ICD-10-CM | POA: Diagnosis not present

## 2016-08-09 DIAGNOSIS — I788 Other diseases of capillaries: Secondary | ICD-10-CM | POA: Diagnosis not present

## 2016-08-09 DIAGNOSIS — L570 Actinic keratosis: Secondary | ICD-10-CM | POA: Diagnosis not present

## 2016-08-09 DIAGNOSIS — L218 Other seborrheic dermatitis: Secondary | ICD-10-CM | POA: Diagnosis not present

## 2016-08-18 DIAGNOSIS — R69 Illness, unspecified: Secondary | ICD-10-CM | POA: Diagnosis not present

## 2017-01-27 DIAGNOSIS — Z6823 Body mass index (BMI) 23.0-23.9, adult: Secondary | ICD-10-CM | POA: Diagnosis not present

## 2017-01-27 DIAGNOSIS — R05 Cough: Secondary | ICD-10-CM | POA: Diagnosis not present

## 2017-01-27 DIAGNOSIS — J302 Other seasonal allergic rhinitis: Secondary | ICD-10-CM | POA: Diagnosis not present

## 2017-01-27 DIAGNOSIS — J029 Acute pharyngitis, unspecified: Secondary | ICD-10-CM | POA: Diagnosis not present

## 2017-01-27 DIAGNOSIS — J0100 Acute maxillary sinusitis, unspecified: Secondary | ICD-10-CM | POA: Diagnosis not present

## 2017-01-30 DIAGNOSIS — J302 Other seasonal allergic rhinitis: Secondary | ICD-10-CM | POA: Diagnosis not present

## 2017-01-30 DIAGNOSIS — J029 Acute pharyngitis, unspecified: Secondary | ICD-10-CM | POA: Diagnosis not present

## 2017-01-30 DIAGNOSIS — Z6823 Body mass index (BMI) 23.0-23.9, adult: Secondary | ICD-10-CM | POA: Diagnosis not present

## 2017-01-30 DIAGNOSIS — J0100 Acute maxillary sinusitis, unspecified: Secondary | ICD-10-CM | POA: Diagnosis not present

## 2017-01-30 DIAGNOSIS — R05 Cough: Secondary | ICD-10-CM | POA: Diagnosis not present

## 2017-03-02 DIAGNOSIS — R69 Illness, unspecified: Secondary | ICD-10-CM | POA: Diagnosis not present

## 2017-04-01 DIAGNOSIS — Z23 Encounter for immunization: Secondary | ICD-10-CM | POA: Diagnosis not present

## 2017-05-30 DIAGNOSIS — R946 Abnormal results of thyroid function studies: Secondary | ICD-10-CM | POA: Diagnosis not present

## 2017-05-30 DIAGNOSIS — Z125 Encounter for screening for malignant neoplasm of prostate: Secondary | ICD-10-CM | POA: Diagnosis not present

## 2017-05-30 DIAGNOSIS — E7849 Other hyperlipidemia: Secondary | ICD-10-CM | POA: Diagnosis not present

## 2017-05-30 DIAGNOSIS — R82998 Other abnormal findings in urine: Secondary | ICD-10-CM | POA: Diagnosis not present

## 2017-05-30 DIAGNOSIS — E559 Vitamin D deficiency, unspecified: Secondary | ICD-10-CM | POA: Diagnosis not present

## 2017-05-30 DIAGNOSIS — R7309 Other abnormal glucose: Secondary | ICD-10-CM | POA: Diagnosis not present

## 2017-06-13 DIAGNOSIS — R7309 Other abnormal glucose: Secondary | ICD-10-CM | POA: Diagnosis not present

## 2017-06-13 DIAGNOSIS — Z0000 Encounter for general adult medical examination without abnormal findings: Secondary | ICD-10-CM | POA: Diagnosis not present

## 2017-06-13 DIAGNOSIS — K635 Polyp of colon: Secondary | ICD-10-CM | POA: Diagnosis not present

## 2017-06-13 DIAGNOSIS — K429 Umbilical hernia without obstruction or gangrene: Secondary | ICD-10-CM | POA: Diagnosis not present

## 2017-06-13 DIAGNOSIS — R011 Cardiac murmur, unspecified: Secondary | ICD-10-CM | POA: Diagnosis not present

## 2017-06-13 DIAGNOSIS — E559 Vitamin D deficiency, unspecified: Secondary | ICD-10-CM | POA: Diagnosis not present

## 2017-06-13 DIAGNOSIS — K5730 Diverticulosis of large intestine without perforation or abscess without bleeding: Secondary | ICD-10-CM | POA: Diagnosis not present

## 2017-06-13 DIAGNOSIS — E7849 Other hyperlipidemia: Secondary | ICD-10-CM | POA: Diagnosis not present

## 2017-06-13 DIAGNOSIS — J302 Other seasonal allergic rhinitis: Secondary | ICD-10-CM | POA: Diagnosis not present

## 2017-06-13 DIAGNOSIS — R946 Abnormal results of thyroid function studies: Secondary | ICD-10-CM | POA: Diagnosis not present

## 2017-06-14 DIAGNOSIS — Z1212 Encounter for screening for malignant neoplasm of rectum: Secondary | ICD-10-CM | POA: Diagnosis not present

## 2017-07-06 DIAGNOSIS — H04123 Dry eye syndrome of bilateral lacrimal glands: Secondary | ICD-10-CM | POA: Diagnosis not present

## 2017-07-06 DIAGNOSIS — H52203 Unspecified astigmatism, bilateral: Secondary | ICD-10-CM | POA: Diagnosis not present

## 2017-07-06 DIAGNOSIS — H0100A Unspecified blepharitis right eye, upper and lower eyelids: Secondary | ICD-10-CM | POA: Diagnosis not present

## 2017-07-06 DIAGNOSIS — H25813 Combined forms of age-related cataract, bilateral: Secondary | ICD-10-CM | POA: Diagnosis not present

## 2017-07-10 DIAGNOSIS — R69 Illness, unspecified: Secondary | ICD-10-CM | POA: Diagnosis not present

## 2017-09-05 DIAGNOSIS — R69 Illness, unspecified: Secondary | ICD-10-CM | POA: Diagnosis not present

## 2018-01-01 DIAGNOSIS — Z23 Encounter for immunization: Secondary | ICD-10-CM | POA: Diagnosis not present

## 2018-03-31 DIAGNOSIS — Z23 Encounter for immunization: Secondary | ICD-10-CM | POA: Diagnosis not present

## 2018-04-26 DIAGNOSIS — L989 Disorder of the skin and subcutaneous tissue, unspecified: Secondary | ICD-10-CM | POA: Diagnosis not present

## 2018-04-26 DIAGNOSIS — Z6823 Body mass index (BMI) 23.0-23.9, adult: Secondary | ICD-10-CM | POA: Diagnosis not present

## 2018-06-12 DIAGNOSIS — E559 Vitamin D deficiency, unspecified: Secondary | ICD-10-CM | POA: Diagnosis not present

## 2018-06-12 DIAGNOSIS — R7309 Other abnormal glucose: Secondary | ICD-10-CM | POA: Diagnosis not present

## 2018-06-12 DIAGNOSIS — R82998 Other abnormal findings in urine: Secondary | ICD-10-CM | POA: Diagnosis not present

## 2018-06-12 DIAGNOSIS — E7849 Other hyperlipidemia: Secondary | ICD-10-CM | POA: Diagnosis not present

## 2018-06-12 DIAGNOSIS — Z125 Encounter for screening for malignant neoplasm of prostate: Secondary | ICD-10-CM | POA: Diagnosis not present

## 2018-06-12 DIAGNOSIS — R946 Abnormal results of thyroid function studies: Secondary | ICD-10-CM | POA: Diagnosis not present

## 2018-06-14 DIAGNOSIS — E7849 Other hyperlipidemia: Secondary | ICD-10-CM | POA: Diagnosis not present

## 2018-06-14 DIAGNOSIS — R7309 Other abnormal glucose: Secondary | ICD-10-CM | POA: Diagnosis not present

## 2018-06-14 DIAGNOSIS — R011 Cardiac murmur, unspecified: Secondary | ICD-10-CM | POA: Diagnosis not present

## 2018-06-14 DIAGNOSIS — K5730 Diverticulosis of large intestine without perforation or abscess without bleeding: Secondary | ICD-10-CM | POA: Diagnosis not present

## 2018-06-14 DIAGNOSIS — E559 Vitamin D deficiency, unspecified: Secondary | ICD-10-CM | POA: Diagnosis not present

## 2018-06-14 DIAGNOSIS — K635 Polyp of colon: Secondary | ICD-10-CM | POA: Diagnosis not present

## 2018-06-14 DIAGNOSIS — R946 Abnormal results of thyroid function studies: Secondary | ICD-10-CM | POA: Diagnosis not present

## 2018-06-14 DIAGNOSIS — D229 Melanocytic nevi, unspecified: Secondary | ICD-10-CM | POA: Diagnosis not present

## 2018-06-14 DIAGNOSIS — R972 Elevated prostate specific antigen [PSA]: Secondary | ICD-10-CM | POA: Diagnosis not present

## 2018-06-14 DIAGNOSIS — Z0000 Encounter for general adult medical examination without abnormal findings: Secondary | ICD-10-CM | POA: Diagnosis not present

## 2018-06-15 ENCOUNTER — Other Ambulatory Visit: Payer: Self-pay | Admitting: Internal Medicine

## 2018-06-15 DIAGNOSIS — Z1212 Encounter for screening for malignant neoplasm of rectum: Secondary | ICD-10-CM | POA: Diagnosis not present

## 2018-06-15 DIAGNOSIS — E785 Hyperlipidemia, unspecified: Secondary | ICD-10-CM

## 2018-06-21 ENCOUNTER — Ambulatory Visit
Admission: RE | Admit: 2018-06-21 | Discharge: 2018-06-21 | Disposition: A | Discharge status: Home / Self Care | Payer: No Typology Code available for payment source | Source: Ambulatory Visit | Attending: Internal Medicine | Admitting: Internal Medicine

## 2018-06-21 DIAGNOSIS — E785 Hyperlipidemia, unspecified: Secondary | ICD-10-CM

## 2018-07-11 DIAGNOSIS — Z23 Encounter for immunization: Secondary | ICD-10-CM | POA: Diagnosis not present

## 2018-07-11 DIAGNOSIS — E1140 Type 2 diabetes mellitus with diabetic neuropathy, unspecified: Secondary | ICD-10-CM | POA: Diagnosis not present

## 2018-07-11 DIAGNOSIS — E1151 Type 2 diabetes mellitus with diabetic peripheral angiopathy without gangrene: Secondary | ICD-10-CM | POA: Diagnosis not present

## 2018-07-20 DIAGNOSIS — Z23 Encounter for immunization: Secondary | ICD-10-CM | POA: Diagnosis not present

## 2018-08-17 DIAGNOSIS — Z23 Encounter for immunization: Secondary | ICD-10-CM | POA: Diagnosis not present

## 2019-04-05 DIAGNOSIS — Z23 Encounter for immunization: Secondary | ICD-10-CM | POA: Diagnosis not present

## 2019-04-11 DIAGNOSIS — H524 Presbyopia: Secondary | ICD-10-CM | POA: Diagnosis not present

## 2019-04-11 DIAGNOSIS — H04123 Dry eye syndrome of bilateral lacrimal glands: Secondary | ICD-10-CM | POA: Diagnosis not present

## 2019-04-11 DIAGNOSIS — H25813 Combined forms of age-related cataract, bilateral: Secondary | ICD-10-CM | POA: Diagnosis not present

## 2019-04-11 DIAGNOSIS — H0100A Unspecified blepharitis right eye, upper and lower eyelids: Secondary | ICD-10-CM | POA: Diagnosis not present

## 2019-06-13 DIAGNOSIS — Z79899 Other long term (current) drug therapy: Secondary | ICD-10-CM | POA: Diagnosis not present

## 2019-06-13 DIAGNOSIS — E7849 Other hyperlipidemia: Secondary | ICD-10-CM | POA: Diagnosis not present

## 2019-06-13 DIAGNOSIS — E559 Vitamin D deficiency, unspecified: Secondary | ICD-10-CM | POA: Diagnosis not present

## 2019-06-13 DIAGNOSIS — Z125 Encounter for screening for malignant neoplasm of prostate: Secondary | ICD-10-CM | POA: Diagnosis not present

## 2019-06-17 DIAGNOSIS — R82998 Other abnormal findings in urine: Secondary | ICD-10-CM | POA: Diagnosis not present

## 2019-06-20 DIAGNOSIS — H04129 Dry eye syndrome of unspecified lacrimal gland: Secondary | ICD-10-CM | POA: Diagnosis not present

## 2019-06-20 DIAGNOSIS — Z0000 Encounter for general adult medical examination without abnormal findings: Secondary | ICD-10-CM | POA: Diagnosis not present

## 2019-06-20 DIAGNOSIS — K429 Umbilical hernia without obstruction or gangrene: Secondary | ICD-10-CM | POA: Diagnosis not present

## 2019-06-20 DIAGNOSIS — Z1331 Encounter for screening for depression: Secondary | ICD-10-CM | POA: Diagnosis not present

## 2019-06-20 DIAGNOSIS — K5730 Diverticulosis of large intestine without perforation or abscess without bleeding: Secondary | ICD-10-CM | POA: Diagnosis not present

## 2019-06-20 DIAGNOSIS — J302 Other seasonal allergic rhinitis: Secondary | ICD-10-CM | POA: Diagnosis not present

## 2019-06-20 DIAGNOSIS — E785 Hyperlipidemia, unspecified: Secondary | ICD-10-CM | POA: Diagnosis not present

## 2019-06-20 DIAGNOSIS — I2584 Coronary atherosclerosis due to calcified coronary lesion: Secondary | ICD-10-CM | POA: Diagnosis not present

## 2019-06-20 DIAGNOSIS — R011 Cardiac murmur, unspecified: Secondary | ICD-10-CM | POA: Diagnosis not present

## 2019-06-20 DIAGNOSIS — K635 Polyp of colon: Secondary | ICD-10-CM | POA: Diagnosis not present

## 2019-06-20 DIAGNOSIS — R972 Elevated prostate specific antigen [PSA]: Secondary | ICD-10-CM | POA: Diagnosis not present

## 2019-06-25 DIAGNOSIS — Z1212 Encounter for screening for malignant neoplasm of rectum: Secondary | ICD-10-CM | POA: Diagnosis not present

## 2019-07-03 ENCOUNTER — Ambulatory Visit: Payer: Medicare HMO | Attending: Internal Medicine

## 2019-07-03 DIAGNOSIS — Z20822 Contact with and (suspected) exposure to covid-19: Secondary | ICD-10-CM

## 2019-07-03 DIAGNOSIS — Z20828 Contact with and (suspected) exposure to other viral communicable diseases: Secondary | ICD-10-CM | POA: Diagnosis not present

## 2019-07-04 LAB — NOVEL CORONAVIRUS, NAA: SARS-CoV-2, NAA: NOT DETECTED

## 2019-07-24 ENCOUNTER — Ambulatory Visit: Payer: Medicare Other | Attending: Internal Medicine

## 2019-07-24 DIAGNOSIS — Z23 Encounter for immunization: Secondary | ICD-10-CM | POA: Insufficient documentation

## 2019-07-24 NOTE — Progress Notes (Signed)
   Covid-19 Vaccination Clinic  Name:  Julian Crawford    MRN: [id number] DOB: 11-17-1950  07/24/2019  [name] was observed post Covid-19 immunization for 15 minutes without incidence. He was provided with Vaccine Information Sheet and instruction to access the V-Safe system.   [name] was instructed to call 911 with any severe reactions post vaccine: Marland Kitchen Difficulty breathing  . Swelling of your face and throat  . A fast heartbeat  . A bad rash all over your body  . Dizziness and weakness    Immunizations Administered    Name Date Dose VIS Date Route   Pfizer COVID-19 Vaccine 07/24/2019  6:41 PM 0.3 mL 06/14/2019 Intramuscular   Manufacturer: [place]   Lot: [id number]   [place]: [id number]

## 2019-08-07 ENCOUNTER — Ambulatory Visit: Payer: No Typology Code available for payment source

## 2019-08-12 ENCOUNTER — Ambulatory Visit: Payer: Medicare HMO | Attending: Internal Medicine

## 2019-08-12 DIAGNOSIS — Z23 Encounter for immunization: Secondary | ICD-10-CM

## 2019-08-12 NOTE — Progress Notes (Signed)
   Covid-19 Vaccination Clinic  Name:  Julian Crawford    MRN: [id number] DOB: January 23, 1951  08/12/2019  Julian Crawford was observed post Covid-19 immunization for 15 minutes without incidence. He was provided with Vaccine Information Sheet and instruction to access the V-Safe system.   Julian Crawford was instructed to call 911 with any severe reactions post vaccine: Marland Kitchen Difficulty breathing  . Swelling of your face and throat  . A fast heartbeat  . A bad rash all over your body  . Dizziness and weakness    Immunizations Administered    Name Date Dose VIS Date Route   Pfizer COVID-19 Vaccine 08/12/2019  9:32 AM 0.3 mL 06/14/2019 Intramuscular   Manufacturer: [place]   Lot: [id number]   [place]: [id number]

## 2019-08-28 DIAGNOSIS — Z0100 Encounter for examination of eyes and vision without abnormal findings: Secondary | ICD-10-CM | POA: Diagnosis not present

## 2019-08-29 DIAGNOSIS — R69 Illness, unspecified: Secondary | ICD-10-CM | POA: Diagnosis not present

## 2019-10-01 ENCOUNTER — Encounter: Payer: Self-pay | Admitting: Cardiology

## 2019-10-01 DIAGNOSIS — I2584 Coronary atherosclerosis due to calcified coronary lesion: Secondary | ICD-10-CM | POA: Diagnosis not present

## 2019-10-01 DIAGNOSIS — Z0000 Encounter for general adult medical examination without abnormal findings: Secondary | ICD-10-CM | POA: Diagnosis not present

## 2019-10-01 DIAGNOSIS — E785 Hyperlipidemia, unspecified: Secondary | ICD-10-CM | POA: Diagnosis not present

## 2019-10-01 DIAGNOSIS — R002 Palpitations: Secondary | ICD-10-CM | POA: Diagnosis not present

## 2019-10-01 DIAGNOSIS — R0602 Shortness of breath: Secondary | ICD-10-CM | POA: Diagnosis not present

## 2019-10-01 DIAGNOSIS — R946 Abnormal results of thyroid function studies: Secondary | ICD-10-CM | POA: Diagnosis not present

## 2019-10-14 ENCOUNTER — Ambulatory Visit: Payer: Medicare HMO | Admitting: Family Medicine

## 2019-10-14 ENCOUNTER — Other Ambulatory Visit: Payer: Self-pay

## 2019-10-14 VITALS — BP 142/82 | Ht 70.0 in | Wt 155.0 lb

## 2019-10-14 DIAGNOSIS — M25561 Pain in right knee: Secondary | ICD-10-CM | POA: Diagnosis not present

## 2019-10-14 DIAGNOSIS — G8929 Other chronic pain: Secondary | ICD-10-CM

## 2019-10-14 NOTE — Patient Instructions (Signed)
You have a popliteus muscle strain and a very small baker's cyst. Do home exercises as directed. Icing 15 minutes at a time as needed. Compression sleeve may be helpful as well when you're going for your walks. Tylenol, aleve only if needed if you can take these medications. Follow up with me as needed. You can consider physical therapy if this becomes bothersome enough and the home exercises are not providing enough relief.  The pain in the front of your knee is from very mild prepatellar bursitis.   Wear a kneepad if needed when you'll be kneeling down.

## 2019-10-14 NOTE — Progress Notes (Signed)
PCP: Shon Baton, MD  Subjective:   HPI: Patient is a 69 y.o. male here for evaluation of right knee pain.  Patient has 1 year history of posterolateral knee pain and prepatellar knee pain. He has been walking 4-5 miles daily because he does not attend the gym due to the pandemic. The pain in the posterolateral knee occurs mostly when walking up hill but does persist after activity and he describes it as a dull pain that sometimes radiates distally and proximally in the leg. The prepatellar pain does not radiate and mostly only happens when working in the garden or kneeling down to do other things. He has not previous injury/trauma to the knee. He denies and swelling, redness or warmth over the area. He denies instability or falls.  BP (!) 142/82   Ht 5\' 10"  (1.778 m)   Wt 155 lb (70.3 kg)   BMI 22.24 kg/m   Review of Systems: See HPI above.     Objective:  Physical Exam:  Gen: NAD, comfortable in exam room  Right knee: No gross deformity, ecchymoses. Minimal prepatellar bursa swelling. No palpable Baker's cyst. Mild TTP over prepatellar bursa.  No joint line, other tenderness. FROM with normal strength flexion and extension. Negative ant/post drawers. Negative valgus/varus testing. Negative lachmans. Negative mcmurrays, apleys, patellar apprehension. NV intact distally.   Assessment & Plan:  1. Right Popliteal muscle strain:  Patient has 1 year history of posterolateral knee pain with walking and knee flexion. Patient does not have pain with resisted knee flexion in the hamstring muscles, has no obvious swelling of the surrounding bursa, no lateral joint line pain or tenderness and had a reassuring ultrasound of gastrocnemius and hamstrings.  -- Popliteal muscle strengthening home exercises -- Naproxen 2 tabs BID prn -- f/u prn  2. Right Prepatellar bursitis: Patient has one year hx of prepatellar knee pain with kneeling and mild swelling of the prepatellar bursa.  -- Knee pad  when kneeling -- f/u prn

## 2019-10-14 NOTE — Progress Notes (Signed)
Cardiology Office Note:    Date:  10/15/2019   ID:  Julian Crawford, DOB Apr 21, 1951, MRN [id number]  PCP:  Shon Baton, MD  Cardiologist:  Donato Heinz, MD  Electrophysiologist:  None   Referring MD: Shon Baton, MD   Chief Complaint  Patient presents with  . New Patient (Initial Visit)  . Palpitations    History of Present Illness:    Julian Crawford is a 69 y.o. male with a hx of CAD, hyperlipidemia who is referred by Dr. Virgina Jock for evaluation of palpitations.  Reports that in last 6 months has had 3 episodes of palpitations, where felt like heart was racing.  Can last up to 8 hours.  Checks BP and very labile.  HR has not been elevated but often says "irregular" on BP monitor.  Reports that he walks 5 to 6 miles per day.  Reports that he has been having lightheadedness with walking.  Denies any chest pain but does report dyspnea with exertion.  No smoking history.  Denies any history of heart disease in his immediate family.  Labs on 10/02/19 showed TSH was normal.  Calcium score on 06/21/18 was 176 (55th percentile).     Past Medical History:  Diagnosis Date  . Colon polyps   . Heart murmur   . Hyperlipidemia     Past Surgical History:  Procedure Laterality Date  . PILONIDAL CYST EXCISION    . TONSILLECTOMY      Current Medications: Current Meds  Medication Sig  . aspirin 81 MG tablet Take 81 mg by mouth 3 (three) times a week.   . cholecalciferol (VITAMIN D) 1000 units tablet Take by mouth daily.    . fish oil-omega-3 fatty acids 1000 MG capsule Take 2 g by mouth 2 (two) times daily.    . multivitamin (THERAGRAN) per tablet Take 1 tablet by mouth daily.    . niacin (NIASPAN) 500 MG CR tablet Take 500 mg by mouth daily.    . rosuvastatin (CRESTOR) 40 MG tablet Take 1 tablet (40 mg total) by mouth daily.  . vitamin C (ASCORBIC ACID) 500 MG tablet Take 500 mg by mouth daily.  Marland Kitchen zolpidem (AMBIEN) 5 MG tablet Take 5 mg by mouth at bedtime as needed.    .  [DISCONTINUED] rosuvastatin (CRESTOR) 20 MG tablet Take 20 mg by mouth daily.  . [DISCONTINUED] simvastatin (ZOCOR) 20 MG tablet TAKE 1 TABLET BY MOUTH ATBEDTIME.   Current Facility-Administered Medications for the 10/15/19 encounter (Office Visit) with Donato Heinz, MD  Medication  . 0.9 %  sodium chloride infusion     Allergies:   Patient has no known allergies.   Social History   Socioeconomic History  . Marital status: Married    Spouse name: Not on file  . Number of children: Not on file  . Years of education: Not on file  . Highest education level: Not on file  Occupational History  . Not on file  Tobacco Use  . Smoking status: Never Smoker  . Smokeless tobacco: Never Used  Substance and Sexual Activity  . Alcohol use: Yes    Alcohol/week: 7.0 - 14.0 standard drinks    Types: 7 - 14 Glasses of wine per week  . Drug use: No  . Sexual activity: Yes  Other Topics Concern  . Not on file  Social History Narrative  . Not on file   Social Determinants of Health   Financial Resource Strain:   . Difficulty of  Paying Living Expenses:   Food Insecurity:   . Worried About Charity fundraiser in the Last Year:   . Arboriculturist in the Last Year:   Transportation Needs:   . Film/video editor (Medical):   Marland Kitchen Lack of Transportation (Non-Medical):   Physical Activity:   . Days of Exercise per Week:   . Minutes of Exercise per Session:   Stress:   . Feeling of Stress :   Social Connections:   . Frequency of Communication with Friends and Family:   . Frequency of Social Gatherings with Friends and Family:   . Attends Religious Services:   . Active Member of Clubs or Organizations:   . Attends Archivist Meetings:   Marland Kitchen Marital Status:      Family History: The patient's family history includes Alzheimer's disease in his father; Dementia in his father and mother; Heart disease in his father; Hyperlipidemia in his mother. There is no history of Colon  cancer.  ROS:   Please see the history of present illness.     All other systems reviewed and are negative.  EKGs/Labs/Other Studies Reviewed:    The following studies were reviewed today:   EKG:  EKG is ordered today.  The ekg ordered today demonstrates normal sinus rhythm, rate 67, no ST/T abnormalities  Recent Labs: No results found for requested labs within last 8760 hours.  Recent Lipid Panel    Component Value Date/Time   CHOL 210 (H) 05/27/2013 0811   TRIG 115.0 05/27/2013 0811   HDL 42.90 05/27/2013 0811   CHOLHDL 5 05/27/2013 0811   VLDL 23.0 05/27/2013 0811   LDLCALC 116 (H) 11/09/2012 0802   LDLDIRECT 146.5 05/27/2013 0811    Physical Exam:    VS:  BP 136/72 (BP Location: Right Arm, Patient Position: Sitting, Cuff Size: Normal)   Pulse 67   Ht 5\' 10"  (1.778 m)   Wt 155 lb (70.3 kg)   BMI 22.24 kg/m     Wt Readings from Last 3 Encounters:  10/15/19 155 lb (70.3 kg)  10/14/19 155 lb (70.3 kg)  04/05/16 171 lb (77.6 kg)     GEN:  Well nourished, well developed in no acute distress HEENT: Normal NECK: No JVD CARDIAC: RRR, no murmurs, rubs, gallops RESPIRATORY:  Clear to auscultation without rales, wheezing or rhonchi  ABDOMEN: Soft, non-tender, non-distended MUSCULOSKELETAL:  No edema; No deformity  SKIN: Warm and dry NEUROLOGIC:  Alert and oriented x 3 PSYCHIATRIC:  Normal affect   ASSESSMENT:    1. DOE (dyspnea on exertion)   2. Palpitations   3. Hyperlipidemia, unspecified hyperlipidemia type   4. Coronary artery disease involving native coronary artery of native heart, angina presence unspecified    PLAN:     Palpitations: Description concerning for arrhythmia, will check 30-day cardiac monitor  Exertional dyspnea/lightheadedness: Denies any chest pain.  Will evaluate further with ETT  CAD: Calcium score 176 (75th percentile) on 06/21/2018.  LDL 96 on 06/13/2019 -Increase rosuvastatin to 40mg  daily for goal LDL <70 -ETT as  above  Hyperlipidemia: On rosuvastatin 20 mg daily.  LDL 96 06/13/19.  Increase rosuvastatin as above  RTC in 3 months   Medication Adjustments/Labs and Tests Ordered: Current medicines are reviewed at length with the patient today.  Concerns regarding medicines are outlined above.  Orders Placed This Encounter  Procedures  . CARDIAC EVENT MONITOR  . EXERCISE TOLERANCE TEST (ETT)  . EKG 12-Lead  . ECHOCARDIOGRAM COMPLETE  Meds ordered this encounter  Medications  . rosuvastatin (CRESTOR) 40 MG tablet    Sig: Take 1 tablet (40 mg total) by mouth daily.    Dispense:  90 tablet    Refill:  3    Dose increase    Patient Instructions  Medication Instructions:  INCREASE rosuvastatin (Crestor) 40 mg daily  *If you need a refill on your cardiac medications before your next appointment, please call your pharmacy*  Lab Work: NONE  Testing/Procedures: Your physician has requested that you have an echocardiogram. Echocardiography is a painless test that uses sound waves to create images of your heart. It provides your doctor with information about the size and shape of your heart and how well your heart's chambers and valves are working. This procedure takes approximately one hour. There are no restrictions for this procedure. This will be done at our [facility] location:  [place] has requested that you have an exercise tolerance test. For further information please visit [email or web address]. Please also follow instruction sheet, as given. --you will need a covid test 3 days prior-we will schedule this for you  Your physician has recommended that you wear an event monitor. Event monitors are medical devices that record the heart's electrical activity. Doctors most often [place] these monitors to diagnose arrhythmias. Arrhythmias are problems with the speed or rhythm of the heartbeat. The monitor is a small, portable device. You can wear one while you  do your normal daily activities. This is usually used to diagnose what is causing palpitations/syncope (passing out).   Follow-Up: At [facility], you and your health needs are our priority.  As part of our continuing mission to provide you with exceptional heart care, we have created designated Provider Care Teams.  These Care Teams include your primary Cardiologist (physician) and Advanced Practice Providers (APPs -  Physician Assistants and Nurse Practitioners) who all work together to provide you with the care you need, when you need it.  We recommend signing up for the patient portal called "MyChart".  Sign up information is provided on this After Visit Summary.  MyChart is used to connect with patients for Virtual Visits (Telemedicine).  Patients are able to view lab/test results, encounter notes, upcoming appointments, etc.  Non-urgent messages can be sent to your provider as well.   To learn more about what you can do with MyChart, go to [email or web address].    Your next appointment:   3 month(s)  The format for your next appointment:   In Person  Provider:   Oswaldo Milian, MD        Signed, Donato Heinz, MD  10/15/2019 10:59 PM    [place]

## 2019-10-15 ENCOUNTER — Encounter: Payer: Self-pay | Admitting: Cardiology

## 2019-10-15 ENCOUNTER — Encounter: Payer: Self-pay | Admitting: *Deleted

## 2019-10-15 ENCOUNTER — Ambulatory Visit: Payer: Medicare HMO | Admitting: Cardiology

## 2019-10-15 ENCOUNTER — Encounter: Payer: Self-pay | Admitting: Family Medicine

## 2019-10-15 VITALS — BP 136/72 | HR 67 | Ht 70.0 in | Wt 155.0 lb

## 2019-10-15 DIAGNOSIS — E785 Hyperlipidemia, unspecified: Secondary | ICD-10-CM | POA: Diagnosis not present

## 2019-10-15 DIAGNOSIS — R0600 Dyspnea, unspecified: Secondary | ICD-10-CM | POA: Diagnosis not present

## 2019-10-15 DIAGNOSIS — R002 Palpitations: Secondary | ICD-10-CM

## 2019-10-15 DIAGNOSIS — R0609 Other forms of dyspnea: Secondary | ICD-10-CM

## 2019-10-15 DIAGNOSIS — I2510 Atherosclerotic heart disease of native coronary artery without angina pectoris: Secondary | ICD-10-CM

## 2019-10-15 MED ORDER — ROSUVASTATIN CALCIUM 40 MG PO TABS: 40.0000 mg | ORAL_TABLET | Freq: Every day | ORAL | 3 refills | Status: DC

## 2019-10-15 NOTE — Progress Notes (Signed)
Patient ID: Julian Crawford, male   DOB: 1950-08-26, 69 y.o.   MRN: [id number] Patient enrolled for [employer] to ship a 30 day cardiac event monitor to his home.  Instructions sent to patients My Chart and will also be included in his monitor kit.

## 2019-10-15 NOTE — Patient Instructions (Signed)
Medication Instructions:  INCREASE rosuvastatin (Crestor) 40 mg daily  *If you need a refill on your cardiac medications before your next appointment, please call your pharmacy*  Lab Work: NONE  Testing/Procedures: Your physician has requested that you have an echocardiogram. Echocardiography is a painless test that uses sound waves to create images of your heart. It provides your doctor with information about the size and shape of your heart and how well your heart's chambers and valves are working. This procedure takes approximately one hour. There are no restrictions for this procedure. This will be done at our [facility] location:  [place] has requested that you have an exercise tolerance test. For further information please visit [email or web address]. Please also follow instruction sheet, as given. --you will need a covid test 3 days prior-we will schedule this for you  Your physician has recommended that you wear an event monitor. Event monitors are medical devices that record the heart's electrical activity. Doctors most often [place] these monitors to diagnose arrhythmias. Arrhythmias are problems with the speed or rhythm of the heartbeat. The monitor is a small, portable device. You can wear one while you do your normal daily activities. This is usually used to diagnose what is causing palpitations/syncope (passing out).   Follow-Up: At [facility], you and your health needs are our priority.  As part of our continuing mission to provide you with exceptional heart care, we have created designated Provider Care Teams.  These Care Teams include your primary Cardiologist (physician) and Advanced Practice Providers (APPs -  Physician Assistants and Nurse Practitioners) who all work together to provide you with the care you need, when you need it.  We recommend signing up for the patient portal called "MyChart".  Sign up information is provided on this  After Visit Summary.  MyChart is used to connect with patients for Virtual Visits (Telemedicine).  Patients are able to view lab/test results, encounter notes, upcoming appointments, etc.  Non-urgent messages can be sent to your provider as well.   To learn more about what you can do with MyChart, go to [email or web address].    Your next appointment:   3 month(s)  The format for your next appointment:   In Person  Provider:   Oswaldo Milian, MD

## 2019-10-19 ENCOUNTER — Ambulatory Visit (INDEPENDENT_AMBULATORY_CARE_PROVIDER_SITE_OTHER): Payer: Medicare HMO

## 2019-10-19 DIAGNOSIS — R002 Palpitations: Secondary | ICD-10-CM

## 2019-10-22 ENCOUNTER — Other Ambulatory Visit (HOSPITAL_COMMUNITY)
Admission: RE | Admit: 2019-10-22 | Discharge: 2019-10-22 | Disposition: A | Discharge status: Home / Self Care | Payer: Medicare HMO | Source: Ambulatory Visit | Attending: Cardiovascular Disease | Admitting: Cardiovascular Disease

## 2019-10-22 DIAGNOSIS — Z01812 Encounter for preprocedural laboratory examination: Secondary | ICD-10-CM | POA: Insufficient documentation

## 2019-10-22 DIAGNOSIS — Z20822 Contact with and (suspected) exposure to covid-19: Secondary | ICD-10-CM | POA: Insufficient documentation

## 2019-10-22 LAB — SARS CORONAVIRUS 2 (TAT 6-24 HRS): SARS Coronavirus 2: NEGATIVE

## 2019-10-23 ENCOUNTER — Telehealth (HOSPITAL_COMMUNITY): Payer: Self-pay

## 2019-10-23 NOTE — Telephone Encounter (Signed)
Encounter complete. 

## 2019-10-24 ENCOUNTER — Telehealth (HOSPITAL_COMMUNITY): Payer: Self-pay

## 2019-10-24 NOTE — Telephone Encounter (Signed)
Encounter complete. 

## 2019-10-25 ENCOUNTER — Ambulatory Visit (HOSPITAL_COMMUNITY)
Admission: RE | Admit: 2019-10-25 | Discharge: 2019-10-25 | Disposition: A | Discharge status: Home / Self Care | Payer: Medicare HMO | Source: Ambulatory Visit | Attending: Cardiovascular Disease | Admitting: Cardiovascular Disease

## 2019-10-25 ENCOUNTER — Other Ambulatory Visit: Payer: Self-pay

## 2019-10-25 DIAGNOSIS — R0600 Dyspnea, unspecified: Secondary | ICD-10-CM | POA: Diagnosis not present

## 2019-10-25 DIAGNOSIS — R0609 Other forms of dyspnea: Secondary | ICD-10-CM

## 2019-10-25 LAB — EXERCISE TOLERANCE TEST
Estimated workload: 11.1 METS
Exercise duration (min): 9 min
Exercise duration (sec): 38 s
MPHR: 150 {beats}/min
Peak HR: 160 {beats}/min
Percent HR: 106 %
RPE: 17
Rest HR: 71 {beats}/min

## 2019-10-28 NOTE — Progress Notes (Signed)
Cardiology Office Note:    Date:  10/29/2019   ID:  Julian Crawford, Julian Crawford 03-Aug-1950, MRN [id number]  PCP:  Shon Baton, MD  Cardiologist:  Donato Heinz, MD  Electrophysiologist:  None   Referring MD: Shon Baton, MD   Chief Complaint  Patient presents with  . Follow-up    ETT.  . Dizziness    History of Present Illness:    Julian Crawford is a 69 y.o. male with a hx of CAD, hyperlipidemia who presents for follow-up.  He was referred by Dr. Virgina Jock for evaluation of palpitations, initially seen on 10/15/2019.  Reports that in last 6 months has had 3 episodes of palpitations, where felt like heart was racing.  Can last up to 8 hours.  Checks BP and very labile.  HR has not been elevated but often says "irregular" on BP monitor.  Reports that he walks 5 to 6 miles per day.  Reports that he has been having lightheadedness with walking.  Denies any chest pain but does report dyspnea with exertion.  No smoking history.  Denies any history of heart disease in his immediate family.  Labs on 10/02/19 showed TSH was normal.  Calcium score on 06/21/18 was 176 (55th percentile).    ETT on 10/25/2019 was high risk.  ST segment elevation of 2 mm in leads V1/2, in addition to horizontal/downsloping ST depressions in inferior and lateral leads.  Since last clinic visit, he reports that he has been doing well.  Denies any chest pain.  Has not been having any lightheadedness recently.     Past Medical History:  Diagnosis Date  . Colon polyps   . Heart murmur   . Hyperlipidemia     Past Surgical History:  Procedure Laterality Date  . PILONIDAL CYST EXCISION    . TONSILLECTOMY      Current Medications: Current Meds  Medication Sig  . aspirin 81 MG tablet Take 81 mg by mouth 3 (three) times a week.   . cholecalciferol (VITAMIN D) 1000 units tablet Take by mouth daily.    . multivitamin (THERAGRAN) per tablet Take 1 tablet by mouth daily.    . niacin (NIASPAN) 500 MG CR tablet Take  500 mg by mouth daily.    . Omega-3 Fatty Acids (FISH OIL OMEGA-3 PO) Take 1,400 mg by mouth in the morning and at bedtime.  . rosuvastatin (CRESTOR) 40 MG tablet Take 1 tablet (40 mg total) by mouth daily.  . vitamin C (ASCORBIC ACID) 500 MG tablet Take 500 mg by mouth daily.  Marland Kitchen zolpidem (AMBIEN) 5 MG tablet Take 5 mg by mouth at bedtime as needed.     Current Facility-Administered Medications for the 10/29/19 encounter (Office Visit) with Donato Heinz, MD  Medication  . 0.9 %  sodium chloride infusion     Allergies:   Patient has no known allergies.   Social History   Socioeconomic History  . Marital status: Married    Spouse name: Not on file  . Number of children: Not on file  . Years of education: Not on file  . Highest education level: Not on file  Occupational History  . Not on file  Tobacco Use  . Smoking status: Never Smoker  . Smokeless tobacco: Never Used  Substance and Sexual Activity  . Alcohol use: Yes    Alcohol/week: 7.0 - 14.0 standard drinks    Types: 7 - 14 Glasses of wine per week  . Drug use: No  .  Sexual activity: Yes  Other Topics Concern  . Not on file  Social History Narrative  . Not on file   Social Determinants of Health   Financial Resource Strain:   . Difficulty of Paying Living Expenses:   Food Insecurity:   . Worried About Charity fundraiser in the Last Year:   . Arboriculturist in the Last Year:   Transportation Needs:   . Film/video editor (Medical):   Marland Kitchen Lack of Transportation (Non-Medical):   Physical Activity:   . Days of Exercise per Week:   . Minutes of Exercise per Session:   Stress:   . Feeling of Stress :   Social Connections:   . Frequency of Communication with Friends and Family:   . Frequency of Social Gatherings with Friends and Family:   . Attends Religious Services:   . Active Member of Clubs or Organizations:   . Attends Archivist Meetings:   Marland Kitchen Marital Status:      Family  History: The patient's family history includes Alzheimer's disease in his father; Dementia in his father and mother; Heart disease in his father; Hyperlipidemia in his mother. There is no history of Colon cancer.  ROS:   Please see the history of present illness.     All other systems reviewed and are negative.  EKGs/Labs/Other Studies Reviewed:    The following studies were reviewed today:   EKG:  EKG is not ordered today.  The ekg ordered 10/15/19 demonstrates normal sinus rhythm, rate 67, no ST/T abnormalities  ETT 10/25/19:  ST segment elevation of 2 mm was noted during stress in the V1 and V2 leads, beginning at 5 minutes of stress, and returning to baseline after 1-5 minutes of recovery.   High risk stress test, very concerning for ischemia. There are ST elevations in V1 and V2 with stage 2 exercise, as well as horizontal and downsloping ST depressions in the inferior and lateral leads. Findings communicated with Dr. Gardiner Rhyme.  Recent Labs: No results found for requested labs within last 8760 hours.  Recent Lipid Panel    Component Value Date/Time   CHOL 210 (H) 05/27/2013 0811   TRIG 115.0 05/27/2013 0811   HDL 42.90 05/27/2013 0811   CHOLHDL 5 05/27/2013 0811   VLDL 23.0 05/27/2013 0811   LDLCALC 116 (H) 11/09/2012 0802   LDLDIRECT 146.5 05/27/2013 0811    Physical Exam:    VS:  BP 114/78 (BP Location: Left Arm, Patient Position: Sitting, Cuff Size: Normal)   Pulse 68   Temp (!) 97.5 F (36.4 C)   Ht 5\' 10"  (1.778 m)   Wt 157 lb (71.2 kg)   BMI 22.53 kg/m     Wt Readings from Last 3 Encounters:  10/29/19 157 lb (71.2 kg)  10/15/19 155 lb (70.3 kg)  10/14/19 155 lb (70.3 kg)     GEN:  Well nourished, well developed in no acute distress HEENT: Normal NECK: No JVD CARDIAC: RRR, no murmurs, rubs, gallops RESPIRATORY:  Clear to auscultation without rales, wheezing or rhonchi  ABDOMEN: Soft, non-tender, non-distended MUSCULOSKELETAL:  No edema; No deformity   SKIN: Warm and dry NEUROLOGIC:  Alert and oriented x 3 PSYCHIATRIC:  Normal affect   ASSESSMENT:    1. Abnormal exercise tolerance test   2. Pre-procedure lab exam   3. Palpitations   4. Hyperlipidemia, unspecified hyperlipidemia type   5. Coronary artery disease involving native coronary artery of native heart, angina presence unspecified  6. DOE (dyspnea on exertion)    PLAN:     Exertional dyspnea/lightheadedness: Denies any chest pain.  ETT high risk, with ST segment elevation of 2 mm in leads V1/2, in addition to horizontal/downsloping ST depressions in inferior and lateral leads. -Plan for cardiac catheterization for further evaluation. Risks and benefits of cardiac catheterization have been discussed with the patient.  These include bleeding, infection, kidney damage, stroke, heart attack, death.  The patient understands these risks and is willing to proceed. -TTE  Palpitations: Description concerning for arrhythmia, will check 30-day cardiac monitor  CAD: Calcium score 176 (75th percentile) on 06/21/2018.  LDL 96 on 06/13/2019 -High risk ETT, planning cardiac catheterization as above -Increased rosuvastatin to 40mg  daily for goal LDL <70 -Continue ASA 81 mg daily -SL NTG prn  Hyperlipidemia: LDL 96 06/13/19,  increased rosuvastatin as above  RTC in 1 month   Medication Adjustments/Labs and Tests Ordered: Current medicines are reviewed at length with the patient today.  Concerns regarding medicines are outlined above.  Orders Placed This Encounter  Procedures  . CBC  . Basic metabolic panel   No orders of the defined types were placed in this encounter.   Patient Instructions     [place] [address] Dept: [phone] Loc: [place]  10/29/2019  You are scheduled for a Cardiac Catheterization on Thursday, May 6 with  Dr. Sherren Mocha.  1. Please arrive at the [facility] (Main Entrance A) at [facility]: [address] [place], [place] [address] at 5:30 AM (This time is two hours before your procedure to ensure your preparation). Free valet parking service is available.   Special note: Every effort is made to have your procedure done on time. Please understand that emergencies sometimes delay scheduled procedures.  2. Diet: Do not eat solid foods after midnight.  The patient may have clear liquids until 5am upon the day of the procedure.  3. Labs: You will need to have blood drawn today  Covid Test: Monday 5/3 at 2:15 pm [place]  4. Medication instructions in preparation for your procedure:   Contrast Allergy: No  On the morning of your procedure, take your Aspirin and any morning medicines NOT listed above.  You may use sips of water.  5. Plan for one night stay--bring personal belongings. 6. Bring a current list of your medications and current insurance cards. 7. You MUST have a responsible person to drive you home. 8. Someone MUST be with you the first 24 hours after you arrive home or your discharge will be delayed. 9. Please wear clothes that are easy to get on and off and wear slip-on shoes.  Thank you for allowing [place] to care for you!   -- [facility] Invasive Cardiovascular services     Signed, Donato Heinz, MD  10/29/2019 4:28 PM    [place]

## 2019-10-28 NOTE — H&P (View-Only) (Signed)
Cardiology Office Note:    Date:  10/29/2019   ID:  Crimson, Helle [birth year], MRN [id number]  PCP:  Shon Baton, MD  Cardiologist:  Donato Heinz, MD  Electrophysiologist:  None   Referring MD: Shon Baton, MD   Chief Complaint  Patient presents with  . Follow-up    ETT.  . Dizziness    History of Present Illness:    Julian Crawford is a 69 y.o. male with a hx of CAD, hyperlipidemia who presents for follow-up.  He was referred by Dr. Virgina Jock for evaluation of palpitations, initially seen on 10/15/2019.  Reports that in last 6 months has had 3 episodes of palpitations, where felt like heart was racing.  Can last up to 8 hours.  Checks BP and very labile.  HR has not been elevated but often says "irregular" on BP monitor.  Reports that he walks 5 to 6 miles per day.  Reports that he has been having lightheadedness with walking.  Denies any chest pain but does report dyspnea with exertion.  No smoking history.  Denies any history of heart disease in his immediate family.  Labs on 10/02/19 showed TSH was normal.  Calcium score on 06/21/18 was 176 (55th percentile).    ETT on 10/25/2019 was high risk.  ST segment elevation of 2 mm in leads V1/2, in addition to horizontal/downsloping ST depressions in inferior and lateral leads.  Since last clinic visit, he reports that he has been doing well.  Denies any chest pain.  Has not been having any lightheadedness recently.     Past Medical History:  Diagnosis Date  . Colon polyps   . Heart murmur   . Hyperlipidemia     Past Surgical History:  Procedure Laterality Date  . PILONIDAL CYST EXCISION    . TONSILLECTOMY      Current Medications: Current Meds  Medication Sig  . aspirin 81 MG tablet Take 81 mg by mouth 3 (three) times a week.   . cholecalciferol (VITAMIN D) 1000 units tablet Take by mouth daily.    . multivitamin (THERAGRAN) per tablet Take 1 tablet by mouth daily.    . niacin (NIASPAN) 500 MG CR tablet Take  500 mg by mouth daily.    . Omega-3 Fatty Acids (FISH OIL OMEGA-3 PO) Take 1,400 mg by mouth in the morning and at bedtime.  . rosuvastatin (CRESTOR) 40 MG tablet Take 1 tablet (40 mg total) by mouth daily.  . vitamin C (ASCORBIC ACID) 500 MG tablet Take 500 mg by mouth daily.  Marland Kitchen zolpidem (AMBIEN) 5 MG tablet Take 5 mg by mouth at bedtime as needed.     Current Facility-Administered Medications for the 10/29/19 encounter (Office Visit) with Donato Heinz, MD  Medication  . 0.9 %  sodium chloride infusion     Allergies:   Patient has no known allergies.   Social History   Socioeconomic History  . Marital status: Married    Spouse name: Not on file  . Number of children: Not on file  . Years of education: Not on file  . Highest education level: Not on file  Occupational History  . Not on file  Tobacco Use  . Smoking status: Never Smoker  . Smokeless tobacco: Never Used  Substance and Sexual Activity  . Alcohol use: Yes    Alcohol/week: 7.0 - 14.0 standard drinks    Types: 7 - 14 Glasses of wine per week  . Drug use: No  .  Sexual activity: Yes  Other Topics Concern  . Not on file  Social History Narrative  . Not on file   Social Determinants of Health   Financial Resource Strain:   . Difficulty of Paying Living Expenses:   Food Insecurity:   . Worried About Charity fundraiser in the Last Year:   . Arboriculturist in the Last Year:   Transportation Needs:   . Film/video editor (Medical):   Marland Kitchen Lack of Transportation (Non-Medical):   Physical Activity:   . Days of Exercise per Week:   . Minutes of Exercise per Session:   Stress:   . Feeling of Stress :   Social Connections:   . Frequency of Communication with Friends and Family:   . Frequency of Social Gatherings with Friends and Family:   . Attends Religious Services:   . Active Member of Clubs or Organizations:   . Attends Archivist Meetings:   Marland Kitchen Marital Status:      Family  History: The patient's family history includes Alzheimer's disease in his father; Dementia in his father and mother; Heart disease in his father; Hyperlipidemia in his mother. There is no history of Colon cancer.  ROS:   Please see the history of present illness.     All other systems reviewed and are negative.  EKGs/Labs/Other Studies Reviewed:    The following studies were reviewed today:   EKG:  EKG is not ordered today.  The ekg ordered 10/15/19 demonstrates normal sinus rhythm, rate 67, no ST/T abnormalities  ETT 10/25/19:  ST segment elevation of 2 mm was noted during stress in the V1 and V2 leads, beginning at 5 minutes of stress, and returning to baseline after 1-5 minutes of recovery.   High risk stress test, very concerning for ischemia. There are ST elevations in V1 and V2 with stage 2 exercise, as well as horizontal and downsloping ST depressions in the inferior and lateral leads. Findings communicated with Dr. Gardiner Rhyme.  Recent Labs: No results found for requested labs within last 8760 hours.  Recent Lipid Panel    Component Value Date/Time   CHOL 210 (H) 05/27/2013 0811   TRIG 115.0 05/27/2013 0811   HDL 42.90 05/27/2013 0811   CHOLHDL 5 05/27/2013 0811   VLDL 23.0 05/27/2013 0811   LDLCALC 116 (H) 11/09/2012 0802   LDLDIRECT 146.5 05/27/2013 0811    Physical Exam:    VS:  BP 114/78 (BP Location: Left Arm, Patient Position: Sitting, Cuff Size: Normal)   Pulse 68   Temp (!) 97.5 F (36.4 C)   Ht 5\' 10"  (1.778 m)   Wt 157 lb (71.2 kg)   BMI 22.53 kg/m     Wt Readings from Last 3 Encounters:  10/29/19 157 lb (71.2 kg)  10/15/19 155 lb (70.3 kg)  10/14/19 155 lb (70.3 kg)     GEN:  Well nourished, well developed in no acute distress HEENT: Normal NECK: No JVD CARDIAC: RRR, no murmurs, rubs, gallops RESPIRATORY:  Clear to auscultation without rales, wheezing or rhonchi  ABDOMEN: Soft, non-tender, non-distended MUSCULOSKELETAL:  No edema; No deformity   SKIN: Warm and dry NEUROLOGIC:  Alert and oriented x 3 PSYCHIATRIC:  Normal affect   ASSESSMENT:    1. Abnormal exercise tolerance test   2. Pre-procedure lab exam   3. Palpitations   4. Hyperlipidemia, unspecified hyperlipidemia type   5. Coronary artery disease involving native coronary artery of native heart, angina presence unspecified  6. DOE (dyspnea on exertion)    PLAN:     Exertional dyspnea/lightheadedness: Denies any chest pain.  ETT high risk, with ST segment elevation of 2 mm in leads V1/2, in addition to horizontal/downsloping ST depressions in inferior and lateral leads. -Plan for cardiac catheterization for further evaluation. Risks and benefits of cardiac catheterization have been discussed with the patient.  These include bleeding, infection, kidney damage, stroke, heart attack, death.  The patient understands these risks and is willing to proceed. -TTE  Palpitations: Description concerning for arrhythmia, will check 30-day cardiac monitor  CAD: Calcium score 176 (75th percentile) on 06/21/2018.  LDL 96 on 06/13/2019 -High risk ETT, planning cardiac catheterization as above -Increased rosuvastatin to 40mg  daily for goal LDL <70 -Continue ASA 81 mg daily -SL NTG prn  Hyperlipidemia: LDL 96 06/13/19,  increased rosuvastatin as above  RTC in 1 month   Medication Adjustments/Labs and Tests Ordered: Current medicines are reviewed at length with the patient today.  Concerns regarding medicines are outlined above.  Orders Placed This Encounter  Procedures  . CBC  . Basic metabolic panel   No orders of the defined types were placed in this encounter.   Patient Instructions     [place] [address] Dept: [phone] Loc: [place]  10/29/2019  You are scheduled for a Cardiac Catheterization on Thursday, May 6 with  Dr. Sherren Mocha.  1. Please arrive at the [facility] (Main Entrance A) at [facility]: [address] [place], [place] [address] at 5:30 AM (This time is two hours before your procedure to ensure your preparation). Free valet parking service is available.   Special note: Every effort is made to have your procedure done on time. Please understand that emergencies sometimes delay scheduled procedures.  2. Diet: Do not eat solid foods after midnight.  The patient may have clear liquids until 5am upon the day of the procedure.  3. Labs: You will need to have blood drawn today  Covid Test: Monday 5/3 at 2:15 pm [place]  4. Medication instructions in preparation for your procedure:   Contrast Allergy: No  On the morning of your procedure, take your Aspirin and any morning medicines NOT listed above.  You may use sips of water.  5. Plan for one night stay--bring personal belongings. 6. Bring a current list of your medications and current insurance cards. 7. You MUST have a responsible person to drive you home. 8. Someone MUST be with you the first 24 hours after you arrive home or your discharge will be delayed. 9. Please wear clothes that are easy to get on and off and wear slip-on shoes.  Thank you for allowing [place] to care for you!   -- [facility] Invasive Cardiovascular services     Signed, Donato Heinz, MD  10/29/2019 4:28 PM    [place]

## 2019-10-29 ENCOUNTER — Other Ambulatory Visit: Payer: Self-pay

## 2019-10-29 ENCOUNTER — Ambulatory Visit: Payer: Medicare HMO | Admitting: Cardiology

## 2019-10-29 ENCOUNTER — Encounter: Payer: Self-pay | Admitting: Cardiology

## 2019-10-29 ENCOUNTER — Ambulatory Visit (HOSPITAL_COMMUNITY): Payer: Medicare HMO | Attending: Internal Medicine

## 2019-10-29 VITALS — BP 114/78 | HR 68 | Temp 97.5°F | Ht 70.0 in | Wt 157.0 lb

## 2019-10-29 DIAGNOSIS — E785 Hyperlipidemia, unspecified: Secondary | ICD-10-CM

## 2019-10-29 DIAGNOSIS — I2510 Atherosclerotic heart disease of native coronary artery without angina pectoris: Secondary | ICD-10-CM | POA: Diagnosis not present

## 2019-10-29 DIAGNOSIS — R0600 Dyspnea, unspecified: Secondary | ICD-10-CM | POA: Diagnosis not present

## 2019-10-29 DIAGNOSIS — R002 Palpitations: Secondary | ICD-10-CM | POA: Diagnosis not present

## 2019-10-29 DIAGNOSIS — Z01812 Encounter for preprocedural laboratory examination: Secondary | ICD-10-CM

## 2019-10-29 DIAGNOSIS — R9430 Abnormal result of cardiovascular function study, unspecified: Secondary | ICD-10-CM | POA: Diagnosis not present

## 2019-10-29 DIAGNOSIS — R0609 Other forms of dyspnea: Secondary | ICD-10-CM

## 2019-10-29 MED ORDER — NITROGLYCERIN 0.4 MG SL SUBL: 0.4000 mg | SUBLINGUAL_TABLET | SUBLINGUAL | 3 refills | Status: AC | PRN

## 2019-10-29 NOTE — Patient Instructions (Addendum)
    [place] [address] Dept: [phone] Loc: [place]  10/29/2019  You are scheduled for a Cardiac Catheterization on Thursday, May 6 with Dr. Sherren Mocha.  1. Please arrive at the [facility] (Main Entrance A) at [facility] [address] [place], [place] [address] at 5:30 AM (This time is two hours before your procedure to ensure your preparation). Free valet parking service is available.   Special note: Every effort is made to have your procedure done on time. Please understand that emergencies sometimes delay scheduled procedures.  2. Diet: Do not eat solid foods after midnight.  The patient may have clear liquids until 5am upon the day of the procedure.  3. Labs: You will need to have blood drawn today  Covid Test: Monday 5/3 at 2:15 pm [place]  4. Medication instructions in preparation for your procedure:   Contrast Allergy: No  On the morning of your procedure, take your Aspirin and any morning medicines NOT listed above.  You may use sips of water.  5. Plan for one night stay--bring personal belongings. 6. Bring a current list of your medications and current insurance cards. 7. You MUST have a responsible person to drive you home. 8. Someone MUST be with you the first 24 hours after you arrive home or your discharge will be delayed. 9. Please wear clothes that are easy to get on and off and wear slip-on shoes.  Thank you for allowing [place] to care for you!   -- [place] Invasive Cardiovascular services

## 2019-10-30 ENCOUNTER — Telehealth: Payer: Self-pay | Admitting: Cardiology

## 2019-10-30 LAB — BASIC METABOLIC PANEL
BUN/Creatinine Ratio: 14 (ref 10–24)
BUN: 11 mg/dL (ref 8–27)
CO2: 27 mmol/L (ref 20–29)
Calcium: 9.8 mg/dL (ref 8.6–10.2)
Chloride: 99 mmol/L (ref 96–106)
Creatinine, Ser: 0.79 mg/dL (ref 0.76–1.27)
GFR calc Af Amer: 105 mL/min/{1.73_m2} (ref >59)
GFR calc non Af Amer: 91 mL/min/{1.73_m2} (ref >59)
Glucose: 88 mg/dL (ref 65–99)
Potassium: 4.8 mmol/L (ref 3.5–5.2)
Sodium: 139 mmol/L (ref 134–144)

## 2019-10-30 LAB — CBC
Hematocrit: 42.5 % (ref 37.5–51.0)
Hemoglobin: 14.1 g/dL (ref 13.0–17.7)
MCH: 30.2 pg (ref 26.6–33.0)
MCHC: 33.2 g/dL (ref 31.5–35.7)
MCV: 91 fL (ref 79–97)
Platelets: 219 10*3/uL (ref 150–450)
RBC: 4.67 x10E6/uL (ref 4.14–5.80)
RDW: 12.1 % (ref 11.6–15.4)
WBC: 8.5 10*3/uL (ref 3.4–10.8)

## 2019-10-30 NOTE — Telephone Encounter (Signed)
New message:    Patient calling to ask a question concerning a appointment. Please call patient back.

## 2019-10-30 NOTE — Telephone Encounter (Signed)
Called patient-he states he couldn't sleep last night due to worrying over having this procedure.  He request to have this Monday 5/3 if possible. (currently scheduled 5/6).   Called cath lab and rescheduled cath for Monday 5/3 at 7:30 with Dr. Saunders Revel.  Patient aware to be at [facility] at 5:30 AM  Labs completed yesterday Covid test scheduled for tomorrow 4/29 at 2:45.     Patient aware and verbalized understanding.

## 2019-10-30 NOTE — Telephone Encounter (Signed)
Called and spoke with pt, states he was speaking with Hayley to schedule his Cath on either Monday or Thursday and after thinking on it he would like to switch to having the Cath done on Monday. He is aware he will need his covid test tomorrow.  Will forward to primary RN to change cath date.

## 2019-10-30 NOTE — Telephone Encounter (Signed)
[id number]

## 2019-10-31 ENCOUNTER — Other Ambulatory Visit (HOSPITAL_COMMUNITY)
Admission: RE | Admit: 2019-10-31 | Discharge: 2019-10-31 | Disposition: A | Discharge status: Home / Self Care | Payer: Medicare HMO | Source: Ambulatory Visit | Attending: Internal Medicine | Admitting: Internal Medicine

## 2019-10-31 DIAGNOSIS — Z01812 Encounter for preprocedural laboratory examination: Secondary | ICD-10-CM | POA: Diagnosis not present

## 2019-10-31 DIAGNOSIS — Z20822 Contact with and (suspected) exposure to covid-19: Secondary | ICD-10-CM | POA: Diagnosis not present

## 2019-10-31 LAB — SARS CORONAVIRUS 2 (TAT 6-24 HRS): SARS Coronavirus 2: NEGATIVE

## 2019-11-04 ENCOUNTER — Other Ambulatory Visit: Payer: Self-pay

## 2019-11-04 ENCOUNTER — Encounter (HOSPITAL_COMMUNITY)
Admission: RE | Disposition: A | Discharge status: Home / Self Care | Payer: Medicare HMO | Source: Ambulatory Visit | Attending: Internal Medicine

## 2019-11-04 ENCOUNTER — Ambulatory Visit (HOSPITAL_COMMUNITY)
Admission: RE | Admit: 2019-11-04 | Discharge: 2019-11-04 | Disposition: A | Discharge status: Home / Self Care | Payer: Medicare HMO | Source: Ambulatory Visit | Attending: Internal Medicine | Admitting: Internal Medicine

## 2019-11-04 ENCOUNTER — Other Ambulatory Visit (HOSPITAL_COMMUNITY): Payer: Medicare HMO

## 2019-11-04 DIAGNOSIS — R0609 Other forms of dyspnea: Secondary | ICD-10-CM | POA: Diagnosis not present

## 2019-11-04 DIAGNOSIS — R002 Palpitations: Secondary | ICD-10-CM | POA: Insufficient documentation

## 2019-11-04 DIAGNOSIS — E785 Hyperlipidemia, unspecified: Secondary | ICD-10-CM | POA: Insufficient documentation

## 2019-11-04 DIAGNOSIS — I2510 Atherosclerotic heart disease of native coronary artery without angina pectoris: Secondary | ICD-10-CM

## 2019-11-04 DIAGNOSIS — Z7982 Long term (current) use of aspirin: Secondary | ICD-10-CM | POA: Diagnosis not present

## 2019-11-04 DIAGNOSIS — R9439 Abnormal result of other cardiovascular function study: Secondary | ICD-10-CM

## 2019-11-04 DIAGNOSIS — Z79899 Other long term (current) drug therapy: Secondary | ICD-10-CM | POA: Diagnosis not present

## 2019-11-04 HISTORY — PX: LEFT HEART CATH AND CORONARY ANGIOGRAPHY: CATH118249

## 2019-11-04 HISTORY — PX: INTRAVASCULAR PRESSURE WIRE/FFR STUDY: CATH118243

## 2019-11-04 LAB — POCT ACTIVATED CLOTTING TIME: Activated Clotting Time: 219 seconds

## 2019-11-04 SURGERY — LEFT HEART CATH AND CORONARY ANGIOGRAPHY
Anesthesia: LOCAL

## 2019-11-04 MED ORDER — SODIUM CHLORIDE 0.9% FLUSH: 3.0000 mL | INTRAVENOUS | Status: DC | PRN

## 2019-11-04 MED ORDER — SODIUM CHLORIDE 0.9 % IV SOLN: INTRAVENOUS | Status: DC

## 2019-11-04 MED ORDER — HEPARIN (PORCINE) IN NACL 1000-0.9 UT/500ML-% IV SOLN
INTRAVENOUS | Status: DC | PRN
  Administered 2019-11-04: 500 mL

## 2019-11-04 MED ORDER — SODIUM CHLORIDE 0.9 % WEIGHT BASED INFUSION
3.0000 mL/kg/h | INTRAVENOUS | Status: AC
  Administered 2019-11-04: 3 mL/kg/h via INTRAVENOUS

## 2019-11-04 MED ORDER — HEPARIN SODIUM (PORCINE) 1000 UNIT/ML IJ SOLN
INTRAMUSCULAR | Status: AC
  Filled 2019-11-04: qty 1

## 2019-11-04 MED ORDER — VERAPAMIL HCL 2.5 MG/ML IV SOLN
INTRAVENOUS | Status: AC
  Filled 2019-11-04: qty 2

## 2019-11-04 MED ORDER — SODIUM CHLORIDE 0.9% FLUSH: 3.0000 mL | Freq: Two times a day (BID) | INTRAVENOUS | Status: DC

## 2019-11-04 MED ORDER — SODIUM CHLORIDE 0.9 % IV SOLN: 250.0000 mL | INTRAVENOUS | Status: DC | PRN

## 2019-11-04 MED ORDER — SODIUM CHLORIDE 0.9 % WEIGHT BASED INFUSION
1.0000 mL/kg/h | INTRAVENOUS | Status: DC
  Administered 2019-11-04: 07:00:00 1 mL/kg/h via INTRAVENOUS

## 2019-11-04 MED ORDER — ASPIRIN 81 MG PO CHEW: 81.0000 mg | CHEWABLE_TABLET | Freq: Every day | ORAL | Status: DC

## 2019-11-04 MED ORDER — LIDOCAINE HCL (PF) 1 % IJ SOLN
INTRAMUSCULAR | Status: DC | PRN
  Administered 2019-11-04: 2 mL

## 2019-11-04 MED ORDER — LABETALOL HCL 5 MG/ML IV SOLN: 10.0000 mg | INTRAVENOUS | Status: DC | PRN

## 2019-11-04 MED ORDER — MIDAZOLAM HCL 2 MG/2ML IJ SOLN
INTRAMUSCULAR | Status: DC | PRN
  Administered 2019-11-04: 1 mg via INTRAVENOUS

## 2019-11-04 MED ORDER — NITROGLYCERIN 1 MG/10 ML FOR IR/CATH LAB
INTRA_ARTERIAL | Status: DC | PRN
  Administered 2019-11-04: 200 ug

## 2019-11-04 MED ORDER — HEPARIN SODIUM (PORCINE) 1000 UNIT/ML IJ SOLN
INTRAMUSCULAR | Status: DC | PRN
  Administered 2019-11-04: 3000 [IU] via INTRAVENOUS
  Administered 2019-11-04: 4000 [IU] via INTRAVENOUS
  Administered 2019-11-04: 3500 [IU] via INTRAVENOUS

## 2019-11-04 MED ORDER — FENTANYL CITRATE (PF) 100 MCG/2ML IJ SOLN
INTRAMUSCULAR | Status: AC
  Filled 2019-11-04: qty 2

## 2019-11-04 MED ORDER — MIDAZOLAM HCL 2 MG/2ML IJ SOLN
INTRAMUSCULAR | Status: AC
  Filled 2019-11-04: qty 2

## 2019-11-04 MED ORDER — NITROGLYCERIN 1 MG/10 ML FOR IR/CATH LAB
INTRA_ARTERIAL | Status: AC
  Filled 2019-11-04: qty 10

## 2019-11-04 MED ORDER — FENTANYL CITRATE (PF) 100 MCG/2ML IJ SOLN
INTRAMUSCULAR | Status: DC | PRN
  Administered 2019-11-04: 50 ug via INTRAVENOUS

## 2019-11-04 MED ORDER — ONDANSETRON HCL 4 MG/2ML IJ SOLN: 4.0000 mg | Freq: Four times a day (QID) | INTRAMUSCULAR | Status: DC | PRN

## 2019-11-04 MED ORDER — ACETAMINOPHEN 325 MG PO TABS: 650.0000 mg | ORAL_TABLET | ORAL | Status: DC | PRN

## 2019-11-04 MED ORDER — IOHEXOL 350 MG/ML SOLN
INTRAVENOUS | Status: DC | PRN
  Administered 2019-11-04: 70 mL

## 2019-11-04 MED ORDER — VERAPAMIL HCL 2.5 MG/ML IV SOLN
INTRAVENOUS | Status: DC | PRN
  Administered 2019-11-04: 10 mL via INTRA_ARTERIAL

## 2019-11-04 MED ORDER — ASPIRIN 81 MG PO CHEW: 81.0000 mg | CHEWABLE_TABLET | ORAL | Status: DC

## 2019-11-04 MED ORDER — HYDRALAZINE HCL 20 MG/ML IJ SOLN: 10.0000 mg | INTRAMUSCULAR | Status: DC | PRN

## 2019-11-04 MED ORDER — HEPARIN (PORCINE) IN NACL 1000-0.9 UT/500ML-% IV SOLN
INTRAVENOUS | Status: AC
  Filled 2019-11-04: qty 1000

## 2019-11-04 MED ORDER — LIDOCAINE HCL (PF) 1 % IJ SOLN
INTRAMUSCULAR | Status: AC
  Filled 2019-11-04: qty 30

## 2019-11-04 MED ORDER — IOHEXOL 350 MG/ML SOLN
INTRAVENOUS | Status: AC
  Filled 2019-11-04: qty 1

## 2019-11-04 SURGICAL SUPPLY — 13 items
CATH 5FR JL3.5 JR4 ANG PIG MP (CATHETERS) ×1 IMPLANT
CATH LAUNCHER 6FR EBU3.5 (CATHETERS) ×1 IMPLANT
DEVICE RAD COMP TR BAND LRG (VASCULAR PRODUCTS) ×1 IMPLANT
GLIDESHEATH SLEND SS 6F .021 (SHEATH) ×1 IMPLANT
GUIDEWIRE ANGLED .035X150CM (WIRE) ×1 IMPLANT
GUIDEWIRE INQWIRE 1.5J.035X260 (WIRE) IMPLANT
GUIDEWIRE PRESSURE X 175 (WIRE) ×1 IMPLANT
INQWIRE 1.5J .035X260CM (WIRE) ×2
KIT ENCORE 26 ADVANTAGE (KITS) ×1 IMPLANT
KIT HEART LEFT (KITS) ×2 IMPLANT
PACK CARDIAC CATHETERIZATION (CUSTOM PROCEDURE TRAY) ×2 IMPLANT
TRANSDUCER W/STOPCOCK (MISCELLANEOUS) ×2 IMPLANT
TUBING CIL FLEX 10 FLL-RA (TUBING) ×2 IMPLANT

## 2019-11-04 NOTE — Discharge Instructions (Signed)
DRINK PLENTY OF FLUIDS FOR THE NEXT 2-3 DAYS.  KEEP ARM ELEVATED THE REMAINDER OF THE DAY.  Radial Site Care  This sheet gives you information about how to care for yourself after your procedure. Your health care provider may also give you more specific instructions. If you have problems or questions, contact your health care provider. What can I expect after the procedure? After the procedure, it is common to have:  Bruising and tenderness at the catheter insertion area. Follow these instructions at home: Medicines  Take over-the-counter and prescription medicines only as told by your health care provider. Insertion site care 1. Follow instructions from your health care provider about how to take care of your insertion site. Make sure you: ? Wash your hands with soap and water before you change your bandage (dressing). If soap and water are not available, use hand sanitizer. ? Change your dressing as told by your health care provider. 2. Check your insertion site every day for signs of infection. Check for: ? Redness, swelling, or pain. ? Fluid or blood. ? Pus or a bad smell. ? Warmth. 3. Do not take baths, swim, or use a hot tub for 5 days. 4. You may shower 24-48 hours after the procedure. ? Remove the dressing and gently wash the site with plain soap and water. ? Pat the area dry with a clean towel. ? Do not rub the site. That could cause bleeding. 5. Do not apply powder or lotion to the site. Activity  1. For 24 hours after the procedure, or as directed by your health care provider: ? Do not flex or bend the affected arm. ? Do not push or pull heavy objects with the affected arm. ? Do not drive yourself home from the hospital or clinic. You may drive 24 hours after the procedure. ? Do not operate machinery or power tools. 2. Do not push, pull or lift anything that is heavier than 10 lb for 5 days. 3. Ask your health care provider when it is okay to: ? Return to work or  school. ? Resume usual physical activities or sports. ? Resume sexual activity. General instructions  If the catheter site starts to bleed, raise your arm and put firm pressure on the site. If the bleeding does not stop, get help right away. This is a medical emergency.  If you went home on the same day as your procedure, a responsible adult should be with you for the first 24 hours after you arrive home.  Keep all follow-up visits as told by your health care provider. This is important. Contact a health care provider if:  You have a fever.  You have redness, swelling, or yellow drainage around your insertion site. Get help right away if:  You have unusual pain at the radial site.  The catheter insertion area swells very fast.  The insertion area is bleeding, and the bleeding does not stop when you hold steady pressure on the area.  Your arm or hand becomes pale, cool, tingly, or numb. These symptoms may represent a serious problem that is an emergency. Do not wait to see if the symptoms will go away. Get medical help right away. Call your local emergency services (911 in the [place] Do not drive yourself to the hospital. Summary  After the procedure, it is common to have bruising and tenderness at the site.  Follow instructions from your health care provider about how to take care of your radial site wound. Check   the wound every day for signs of infection.  Do not push, pull or lift anything that is heavier than 10 lb for 5 days.  This information is not intended to replace advice given to you by your health care provider. Make sure you discuss any questions you have with your health care provider. Document Revised: 07/26/2017 Document Reviewed: 07/26/2017 Elsevier Patient Education  2020 [employer] 

## 2019-11-04 NOTE — Interval H&P Note (Signed)
History and Physical Interval Note:  11/04/2019 7:42 AM  Julian Crawford  has presented today for surgery, with the diagnosis of abnormal exercise test.  The various methods of treatment have been discussed with the patient and family. After consideration of risks, benefits and other options for treatment, the patient has consented to  Procedure(s): LEFT HEART CATH AND CORONARY ANGIOGRAPHY (N/A) as a surgical intervention.  The patient's history has been reviewed, patient examined, no change in status, stable for surgery.  I have reviewed the patient's chart and labs.  Questions were answered to the patient's satisfaction.     Cath Lab Visit (complete for each Cath Lab visit)  Clinical Evaluation Leading to the Procedure:   ACS: No.  Non-ACS:    Anginal Classification: CCS I  Anti-ischemic medical therapy: No Therapy  Non-Invasive Test Results: High-risk stress test findings: cardiac mortality >3%/year  Prior CABG: No previous CABG  Rynn Markiewicz

## 2019-11-04 NOTE — Brief Op Note (Signed)
BRIEF CARDIAC CATHETERIZATION NOTE  11/04/2019  8:53 AM  PATIENT:  Julian Crawford  69 y.o. male  PRE-OPERATIVE DIAGNOSIS:  abnormal exercise test  POST-OPERATIVE DIAGNOSIS: Non-obstructive CAD  PROCEDURE:  Procedure(s): LEFT HEART CATH AND CORONARY ANGIOGRAPHY (N/A) INTRAVASCULAR PRESSURE WIRE/FFR STUDY (N/A)  SURGEON:  Surgeon(s) and Role:    * Saadiya Wilfong, MD - Primary  FINDINGS: 1. Moderate single-vessel CAD with 50% mid LAD stenosis that is not hemodynamically significant (RFR = 0.96). 2. Normal LVEDP.  RECOMMENDATIONS: 1. Medical therapy and risk factor modification to prevent progression of disease.  Nelva Bush, MD [facility]

## 2019-11-26 NOTE — Progress Notes (Signed)
Cardiology Office Note:    Date:  12/01/2019   ID:  Julian Crawford, Julian Crawford 11/11/1950, MRN [id number]  PCP:  Shon Baton, MD  Cardiologist:  Donato Heinz, MD  Electrophysiologist:  None   Referring MD: Shon Baton, MD   Chief Complaint  Patient presents with  . Palpitations    History of Present Illness:    Julian Crawford is a 69 y.o. male with a hx of CAD, hyperlipidemia who presents for follow-up.  He was referred by Dr. Virgina Jock for evaluation of palpitations, initially seen on 10/15/2019.  Reported that in prior 6 months has had 3 episodes of palpitations, where felt like heart was racing.  Can last up to 8 hours.  Checks BP and very labile.  HR has not been elevated but often says "irregular" on BP monitor.  Reports that he walks 5 to 6 miles per day.  Reports that he has been having lightheadedness with walking.  Denies any chest pain but does report dyspnea with exertion.  No smoking history.  Denies any history of heart disease in his immediate family.  Labs on 10/02/19 showed TSH was normal.  Calcium score on 06/21/18 was 176 (55th percentile).    ETT on 10/25/2019 was high risk.  ST segment elevation of 2 mm in leads V1/2, in addition to horizontal/downsloping ST depressions in inferior and lateral leads.  TTE 10/29/2019 showed normal biventricular function, grade 1 diastolic dysfunction, mild MR, SAM with 26 mmHg LVOT gradient.  Cardiac catheterization was done on 12/01/2019, which showed 50% mid LAD stenosis (not significant by RFR) and 20% D2 and mid/distal RCA lesions.  Cardiac monitor on 11/20/2019 showed atrial fibrillation/flutter with average heart rate 114 bpm; total AF burden less than 1%.  Also with occasional PVCs (3% of beats)   Since last clinic visit, he reports that he is doing well.  Continues to walk 5-6 miles.  Denies any chest pain or dyspnea.  He denies any history of bleeding issues.  States that when he wore his monitor he did not have  palpitations.   Past Medical History:  Diagnosis Date  . Colon polyps   . Heart murmur   . Hyperlipidemia     Past Surgical History:  Procedure Laterality Date  . INTRAVASCULAR PRESSURE WIRE/FFR STUDY N/A 11/04/2019   Procedure: INTRAVASCULAR PRESSURE WIRE/FFR STUDY;  Surgeon: Nelva Bush, MD;  Location: [facility];  Service: Cardiovascular;  Laterality: N/A;  . LEFT HEART CATH AND CORONARY ANGIOGRAPHY N/A 11/04/2019   Procedure: LEFT HEART CATH AND CORONARY ANGIOGRAPHY;  Surgeon: Nelva Bush, MD;  Location: [facility];  Service: Cardiovascular;  Laterality: N/A;  . PILONIDAL CYST EXCISION    . TONSILLECTOMY      Current Medications: Current Meds  Medication Sig  . cholecalciferol (VITAMIN D) 1000 units tablet Take 1,000 Units by mouth daily.   . multivitamin (THERAGRAN) per tablet Take 1 tablet by mouth daily.    . niacin (NIASPAN) 500 MG CR tablet Take 500 mg by mouth daily.    . nitroGLYCERIN (NITROSTAT) 0.4 MG SL tablet Place 1 tablet (0.4 mg total) under the tongue every 5 (five) minutes as needed for chest pain.  . Omega-3 Fatty Acids (FISH OIL OMEGA-3 PO) Take 1,400 mg by mouth in the morning and at bedtime.  Marland Kitchen Propylene Glycol-Glycerin (SOOTHE) 0.6-0.6 % SOLN Place 1 drop into both eyes daily as needed.  . rosuvastatin (CRESTOR) 40 MG tablet Take 1 tablet (40 mg total) by  mouth daily.  . vitamin C (ASCORBIC ACID) 500 MG tablet Take 500 mg by mouth daily.  Marland Kitchen zolpidem (AMBIEN) 5 MG tablet Take 5 mg by mouth at bedtime as needed for sleep.   . [DISCONTINUED] aspirin 81 MG tablet Take 81 mg by mouth 3 (three) times a week.      Allergies:   Patient has no known allergies.   Social History   Socioeconomic History  . Marital status: Married    Spouse name: Not on file  . Number of children: Not on file  . Years of education: Not on file  . Highest education level: Not on file  Occupational History  . Not on file  Tobacco Use  . Smoking status:  Never Smoker  . Smokeless tobacco: Never Used  Substance and Sexual Activity  . Alcohol use: Yes    Alcohol/week: 7.0 - 14.0 standard drinks    Types: 7 - 14 Glasses of wine per week  . Drug use: No  . Sexual activity: Yes  Other Topics Concern  . Not on file  Social History Narrative  . Not on file   Social Determinants of Health   Financial Resource Strain:   . Difficulty of Paying Living Expenses:   Food Insecurity:   . Worried About Charity fundraiser in the Last Year:   . Arboriculturist in the Last Year:   Transportation Needs:   . Film/video editor (Medical):   Marland Kitchen Lack of Transportation (Non-Medical):   Physical Activity:   . Days of Exercise per Week:   . Minutes of Exercise per Session:   Stress:   . Feeling of Stress :   Social Connections:   . Frequency of Communication with Friends and Family:   . Frequency of Social Gatherings with Friends and Family:   . Attends Religious Services:   . Active Member of Clubs or Organizations:   . Attends Archivist Meetings:   Marland Kitchen Marital Status:      Family History: The patient's family history includes Alzheimer's disease in his father; Dementia in his father and mother; Heart disease in his father; Hyperlipidemia in his mother. There is no history of Colon cancer.  ROS:   Please see the history of present illness.     All other systems reviewed and are negative.  EKGs/Labs/Other Studies Reviewed:    The following studies were reviewed today:   EKG:  EKG is not ordered today.  The ekg ordered 10/15/19 demonstrates normal sinus rhythm, rate 67, no ST/T abnormalities  ETT 10/25/19:  ST segment elevation of 2 mm was noted during stress in the V1 and V2 leads, beginning at 5 minutes of stress, and returning to baseline after 1-5 minutes of recovery.   High risk stress test, very concerning for ischemia. There are ST elevations in V1 and V2 with stage 2 exercise, as well as horizontal and downsloping ST  depressions in the inferior and lateral leads. Findings communicated with Dr. Gardiner Rhyme.  Cath 11/04/19: Conclusions: 1. Mild to moderate, non-obstructive coronary artery disease, including 50% mid LAD stenosis (not hemodynamically significant by RFR) and 20% D2 and mid/distal RCA lesions. 2. Normal left ventricular filling pressure.  Recommendations: 1. Medical therapy and risk factor modification to prevent progression of disease.    Cardiac monitor 11/20/19: 3 episodes of atrial fibrillation/atrial flutter with rates 92-163 (average 114 bpm). Less than 1% atrial fibrillation burden. Longest episode lasted 1 hour.  Occasional PVCs (3% of beats)  Predominant rhythm is sinus rhythm. Range is 56 to 163 bpm with average of 80 bpm.  3 episodes of atrial fibrillation/atrial flutter, longest lasting 1 hour.  Rates the 92-163 (average 114 bpm).  Less than 1% atrial fibrillation burden.   4 beat run of NSVT.  No sustained ventricular tachycardia, significant pause, or high degree AV block. Total ventricular ectopy 3%. 3 patient triggered events, corresponding to sinus rhythm.  Recent Labs: 10/29/2019: BUN 11; Creatinine, Ser 0.79; Hemoglobin 14.1; Platelets 219; Potassium 4.8; Sodium 139  Recent Lipid Panel    Component Value Date/Time   CHOL 210 (H) 05/27/2013 0811   TRIG 115.0 05/27/2013 0811   HDL 42.90 05/27/2013 0811   CHOLHDL 5 05/27/2013 0811   VLDL 23.0 05/27/2013 0811   LDLCALC 116 (H) 11/09/2012 0802   LDLDIRECT 146.5 05/27/2013 0811    Physical Exam:    VS:  BP 112/62   Pulse 76   Ht 5\' 10"  (1.778 m)   Wt 157 lb 12.8 oz (71.6 kg)   SpO2 98%   BMI 22.64 kg/m     Wt Readings from Last 3 Encounters:  11/28/19 157 lb 12.8 oz (71.6 kg)  11/04/19 154 lb (69.9 kg)  10/29/19 157 lb (71.2 kg)     GEN:  Well nourished, well developed in no acute distress HEENT: Normal NECK: No JVD CARDIAC: RRR, no murmurs, rubs, gallops RESPIRATORY:  Clear to auscultation without rales,  wheezing or rhonchi  ABDOMEN: Soft, non-tender, non-distended MUSCULOSKELETAL:  No edema; No deformity  SKIN: Warm and dry NEUROLOGIC:  Alert and oriented x 3 PSYCHIATRIC:  Normal affect   ASSESSMENT:    1. Paroxysmal atrial fibrillation (HCC)   2. Coronary artery disease involving native coronary artery of native heart without angina pectoris   3. Hyperlipidemia, unspecified hyperlipidemia type    PLAN:    Atrial fibrillation/flutter: New diagnosis.  CHA2DS2-VASc score 2 (age, CAD) -Start metoprolol 25 mg twice daily -Start Eliquis 5 mg twice daily   CAD: Calcium score 176 (75th percentile) on 06/21/2018.  LDL 96 on 06/13/2019.  ETT high risk, with ST segment elevation of 2 mm in leads V1/2, in addition to horizontal/downsloping ST depressions in inferior and lateral leads.  which showed 50% mid LAD stenosis (not significant by RFR) and 20% D2 and mid/distal RCA lesions.  TTE shows normal LV systolic function -Increased rosuvastatin to 40mg  daily for goal LDL <70 last month. -Stop aspirin since starting Eliquis as above -SL NTG prn   Hyperlipidemia: LDL 96 06/13/19,  increased rosuvastatin on 10/25/19 to 40 mg   RTC in 3 months   Medication Adjustments/Labs and Tests Ordered: Current medicines are reviewed at length with the patient today.  Concerns regarding medicines are outlined above.  Orders Placed This Encounter  Procedures  . EKG 12-Lead   Meds ordered this encounter  Medications  . metoprolol tartrate (LOPRESSOR) 25 MG tablet    Sig: Take 1 tablet (25 mg total) by mouth 2 (two) times daily.    Dispense:  180 tablet    Refill:  3  . apixaban (ELIQUIS) 5 MG TABS tablet    Sig: Take 1 tablet (5 mg total) by mouth 2 (two) times daily.    Dispense:  180 tablet    Refill:  3    Patient Instructions  Medication Instructions:  STOP Aspirin START Eliquis 5 mg two times daily START metoprolol tartrate (Lopressor) 25 mg two times daily  *If you need a refill on your  cardiac  medications before your next appointment, please call your pharmacy*  Follow-Up: At [facility], you and your health needs are our priority.  As part of our continuing mission to provide you with exceptional heart care, we have created designated Provider Care Teams.  These Care Teams include your primary Cardiologist (physician) and Advanced Practice Providers (APPs -  Physician Assistants and Nurse Practitioners) who all work together to provide you with the care you need, when you need it.  We recommend signing up for the patient portal called "MyChart".  Sign up information is provided on this After Visit Summary.  MyChart is used to connect with patients for Virtual Visits (Telemedicine).  Patients are able to view lab/test results, encounter notes, upcoming appointments, etc.  Non-urgent messages can be sent to your provider as well.   To learn more about what you can do with MyChart, go to [email or web address].    Your next appointment:   3 month(s)  The format for your next appointment:   In Person  Provider:   Oswaldo Milian, MD        Signed, Donato Heinz, MD  12/01/2019 6:23 PM    [facility]

## 2019-11-28 ENCOUNTER — Other Ambulatory Visit: Payer: Self-pay

## 2019-11-28 ENCOUNTER — Ambulatory Visit (INDEPENDENT_AMBULATORY_CARE_PROVIDER_SITE_OTHER): Payer: Medicare HMO | Admitting: Cardiology

## 2019-11-28 VITALS — BP 112/62 | HR 76 | Ht 70.0 in | Wt 157.8 lb

## 2019-11-28 DIAGNOSIS — E785 Hyperlipidemia, unspecified: Secondary | ICD-10-CM

## 2019-11-28 DIAGNOSIS — I2510 Atherosclerotic heart disease of native coronary artery without angina pectoris: Secondary | ICD-10-CM

## 2019-11-28 DIAGNOSIS — I480 Paroxysmal atrial fibrillation: Secondary | ICD-10-CM | POA: Diagnosis not present

## 2019-11-28 MED ORDER — METOPROLOL TARTRATE 25 MG PO TABS
25.0000 mg | ORAL_TABLET | Freq: Two times a day (BID) | ORAL | 3 refills | Status: DC
Start: 2019-11-28 — End: 2019-12-24

## 2019-11-28 MED ORDER — APIXABAN 5 MG PO TABS: 5.0000 mg | ORAL_TABLET | Freq: Two times a day (BID) | ORAL | 3 refills | Status: DC

## 2019-11-28 NOTE — Patient Instructions (Signed)
Medication Instructions:  STOP Aspirin START Eliquis 5 mg two times daily START metoprolol tartrate (Lopressor) 25 mg two times daily  *If you need a refill on your cardiac medications before your next appointment, please call your pharmacy*  Follow-Up: At [facility], you and your health needs are our priority.  As part of our continuing mission to provide you with exceptional heart care, we have created designated Provider Care Teams.  These Care Teams include your primary Cardiologist (physician) and Advanced Practice Providers (APPs -  Physician Assistants and Nurse Practitioners) who all work together to provide you with the care you need, when you need it.  We recommend signing up for the patient portal called "MyChart".  Sign up information is provided on this After Visit Summary.  MyChart is used to connect with patients for Virtual Visits (Telemedicine).  Patients are able to view lab/test results, encounter notes, upcoming appointments, etc.  Non-urgent messages can be sent to your provider as well.   To learn more about what you can do with MyChart, go to [email or web address].    Your next appointment:   3 month(s)  The format for your next appointment:   In Person  Provider:   Oswaldo Milian, MD

## 2019-12-24 ENCOUNTER — Other Ambulatory Visit: Payer: Self-pay | Admitting: Cardiology

## 2020-02-08 DIAGNOSIS — Z20822 Contact with and (suspected) exposure to covid-19: Secondary | ICD-10-CM | POA: Diagnosis not present

## 2020-02-14 NOTE — Progress Notes (Addendum)
Cardiology Office Note:    Date:  02/17/2020   ID:  Wane, Mollett 22-Dec-1950, MRN [id number]  PCP:  Shon Baton, MD  Cardiologist:  Donato Heinz, MD  Electrophysiologist:  None   Referring MD: Shon Baton, MD   Chief Complaint  Patient presents with  . Atrial Fibrillation    History of Present Illness:    Julian Crawford is a 69 y.o. male with a hx of CAD, hyperlipidemia who presents for follow-up.  He was referred by Dr. Virgina Jock for evaluation of palpitations, initially seen on 10/15/2019.  Reported that in prior 6 months has had 3 episodes of palpitations, where felt like heart was racing.  Can last up to 8 hours.  Checks BP and very labile.  HR has not been elevated but often says "irregular" on BP monitor.  Reports that he walks 5 to 6 miles per day.  Reports that he has been having lightheadedness with walking.  Denies any chest pain but does report dyspnea with exertion.  No smoking history.  Denies any history of heart disease in his immediate family.  Labs on 10/02/19 showed TSH was normal.  Calcium score on 06/21/18 was 176 (55th percentile).    ETT on 10/25/2019 was high risk.  ST segment elevation of 2 mm in leads V1/2, in addition to horizontal/downsloping ST depressions in inferior and lateral leads.  TTE 10/29/2019 showed normal biventricular function, grade 1 diastolic dysfunction, mild MR, SAM with 26 mmHg LVOT gradient.  Cardiac catheterization was done on 12/01/2019, which showed 50% mid LAD stenosis (not significant by RFR) and 20% D2 and mid/distal RCA lesions.  Cardiac monitor on 11/20/2019 showed atrial fibrillation/flutter with average heart rate 114 bpm; total AF burden less than 1%.  Also with occasional PVCs (3% of beats)   Since last clinic visit,  he reports that he has been doing well.  He has been walking 5 miles per day.  He denies any chest pain.  States that his palpitations have resolved.  He has been using Kardia monitor, checks heart rhythm  about twice per day and has not noted any atrial fibrillation.  Heart rate has been in the 50s to 60s when he checks.  He has been taking Eliquis, denies any bleeding issues.   Past Medical History:  Diagnosis Date  . Colon polyps   . Heart murmur   . Hyperlipidemia     Past Surgical History:  Procedure Laterality Date  . INTRAVASCULAR PRESSURE WIRE/FFR STUDY N/A 11/04/2019   Procedure: INTRAVASCULAR PRESSURE WIRE/FFR STUDY;  Surgeon: Nelva Bush, MD;  Location: [facility];  Service: Cardiovascular;  Laterality: N/A;  . LEFT HEART CATH AND CORONARY ANGIOGRAPHY N/A 11/04/2019   Procedure: LEFT HEART CATH AND CORONARY ANGIOGRAPHY;  Surgeon: Nelva Bush, MD;  Location: [facility];  Service: Cardiovascular;  Laterality: N/A;  . PILONIDAL CYST EXCISION    . TONSILLECTOMY      Current Medications: Current Meds  Medication Sig  . apixaban (ELIQUIS) 5 MG TABS tablet Take 1 tablet (5 mg total) by mouth 2 (two) times daily.  . cholecalciferol (VITAMIN D) 1000 units tablet Take 1,000 Units by mouth daily.   . metoprolol tartrate (LOPRESSOR) 25 MG tablet Take 0.5 tablets (12.5 mg total) by mouth 2 (two) times daily.  . multivitamin (THERAGRAN) per tablet Take 1 tablet by mouth daily.    . niacin (NIASPAN) 500 MG CR tablet Take 500 mg by mouth daily.    Marland Kitchen  nitroGLYCERIN (NITROSTAT) 0.4 MG SL tablet Place 1 tablet (0.4 mg total) under the tongue every 5 (five) minutes as needed for chest pain.  . Omega-3 Fatty Acids (FISH OIL OMEGA-3 PO) Take 1,400 mg by mouth in the morning and at bedtime.  Marland Kitchen Propylene Glycol-Glycerin (SOOTHE) 0.6-0.6 % SOLN Place 1 drop into both eyes daily as needed.  . rosuvastatin (CRESTOR) 40 MG tablet Take 1 tablet (40 mg total) by mouth daily.  . vitamin C (ASCORBIC ACID) 500 MG tablet Take 500 mg by mouth daily.  Marland Kitchen zolpidem (AMBIEN) 5 MG tablet Take 5 mg by mouth at bedtime as needed for sleep.      Allergies:   Patient has no known allergies.    Social History   Socioeconomic History  . Marital status: Married    Spouse name: Not on file  . Number of children: Not on file  . Years of education: Not on file  . Highest education level: Not on file  Occupational History  . Not on file  Tobacco Use  . Smoking status: Never Smoker  . Smokeless tobacco: Never Used  Substance and Sexual Activity  . Alcohol use: Yes    Alcohol/week: 7.0 - 14.0 standard drinks    Types: 7 - 14 Glasses of wine per week  . Drug use: No  . Sexual activity: Yes  Other Topics Concern  . Not on file  Social History Narrative  . Not on file   Social Determinants of Health   Financial Resource Strain:   . Difficulty of Paying Living Expenses:   Food Insecurity:   . Worried About Charity fundraiser in the Last Year:   . Arboriculturist in the Last Year:   Transportation Needs:   . Film/video editor (Medical):   Marland Kitchen Lack of Transportation (Non-Medical):   Physical Activity:   . Days of Exercise per Week:   . Minutes of Exercise per Session:   Stress:   . Feeling of Stress :   Social Connections:   . Frequency of Communication with Friends and Family:   . Frequency of Social Gatherings with Friends and Family:   . Attends Religious Services:   . Active Member of Clubs or Organizations:   . Attends Archivist Meetings:   Marland Kitchen Marital Status:      Family History: The patient's family history includes Alzheimer's disease in his father; Dementia in his father and mother; Heart disease in his father; Hyperlipidemia in his mother. There is no history of Colon cancer.  ROS:   Please see the history of present illness.     All other systems reviewed and are negative.  EKGs/Labs/Other Studies Reviewed:    The following studies were reviewed today:   EKG:  EKG is ordered today.  The ekg ordered 02/17/20 demonstrates normal sinus rhythm, rate 59, no ST/T abnormalities  ETT 10/25/19:  ST segment elevation of 2 mm was noted during  stress in the V1 and V2 leads, beginning at 5 minutes of stress, and returning to baseline after 1-5 minutes of recovery.   High risk stress test, very concerning for ischemia. There are ST elevations in V1 and V2 with stage 2 exercise, as well as horizontal and downsloping ST depressions in the inferior and lateral leads. Findings communicated with Dr. Gardiner Rhyme.  Cath 11/04/19: Conclusions: 1. Mild to moderate, non-obstructive coronary artery disease, including 50% mid LAD stenosis (not hemodynamically significant by RFR) and 20% D2 and mid/distal RCA  lesions. 2. Normal left ventricular filling pressure.  Recommendations: 1. Medical therapy and risk factor modification to prevent progression of disease.    Cardiac monitor 11/20/19: 3 episodes of atrial fibrillation/atrial flutter with rates 92-163 (average 114 bpm). Less than 1% atrial fibrillation burden. Longest episode lasted 1 hour.  Occasional PVCs (3% of beats)   Predominant rhythm is sinus rhythm. Range is 56 to 163 bpm with average of 80 bpm.  3 episodes of atrial fibrillation/atrial flutter, longest lasting 1 hour.  Rates the 92-163 (average 114 bpm).  Less than 1% atrial fibrillation burden.   4 beat run of NSVT.  No sustained ventricular tachycardia, significant pause, or high degree AV block. Total ventricular ectopy 3%. 3 patient triggered events, corresponding to sinus rhythm.  Recent Labs: 10/29/2019: BUN 11; Creatinine, Ser 0.79; Hemoglobin 14.1; Platelets 219; Potassium 4.8; Sodium 139  Recent Lipid Panel    Component Value Date/Time   CHOL 210 (H) 05/27/2013 0811   TRIG 115.0 05/27/2013 0811   HDL 42.90 05/27/2013 0811   CHOLHDL 5 05/27/2013 0811   VLDL 23.0 05/27/2013 0811   LDLCALC 116 (H) 11/09/2012 0802   LDLDIRECT 146.5 05/27/2013 0811    Physical Exam:    VS:  BP 100/62   Pulse (!) 59   Ht 5\' 10"  (1.778 m)   Wt 158 lb 3.2 oz (71.8 kg)   SpO2 98%   BMI 22.70 kg/m     Wt Readings from Last 3 Encounters:   02/17/20 158 lb 3.2 oz (71.8 kg)  11/28/19 157 lb 12.8 oz (71.6 kg)  11/04/19 154 lb (69.9 kg)     GEN:  Well nourished, well developed in no acute distress HEENT: Normal NECK: No JVD CARDIAC: RRR, no murmurs, rubs, gallops RESPIRATORY:  Clear to auscultation without rales, wheezing or rhonchi  ABDOMEN: Soft, non-tender, non-distended MUSCULOSKELETAL:  No edema; No deformity  SKIN: Warm and dry NEUROLOGIC:  Alert and oriented x 3 PSYCHIATRIC:  Normal affect   ASSESSMENT:    1. Paroxysmal atrial fibrillation (HCC)   2. Coronary artery disease involving native coronary artery of native heart without angina pectoris   3. Hyperlipidemia, unspecified hyperlipidemia type    PLAN:    Atrial fibrillation/flutter: Diagnosed on cardiac monitor on 11/20/2019 (<1% burden). CHA2DS2-VASc score 2 (age, CAD) -Continue metoprolol 25 mg twice daily -Continue Eliquis 5 mg twice daily   CAD: Calcium score 176 (75th percentile) on 06/21/2018.  LDL 96 on 06/13/2019.  ETT high risk, with ST segment elevation of 2 mm in leads V1/2, in addition to horizontal/downsloping ST depressions in inferior and lateral leads.  Cath 11/04/19 showed 50% mid LAD stenosis (not significant by RFR) and 20% D2 and mid/distal RCA lesions.  TTE shows normal LV systolic function -Continue rosuvastatin 40 mg daily  -SL NTG prn   Hyperlipidemia: LDL 96 06/13/19,  increased rosuvastatin on 10/25/19 to 40 mg daily.  Will check lipid panel, goal LDL less than 70   RTC in  6 months   Medication Adjustments/Labs and Tests Ordered: Current medicines are reviewed at length with the patient today.  Concerns regarding medicines are outlined above.  Orders Placed This Encounter  Procedures  . Lipid panel  . EKG 12-Lead   No orders of the defined types were placed in this encounter.   Patient Instructions  Medication Instructions:  Your physician recommends that you continue on your current medications as directed. Please refer  to the Current Medication list given to you today.  *If you  need a refill on your cardiac medications before your next appointment, please call your pharmacy*   Lab Work: Lipid today  If you have labs (blood work) drawn today and your tests are completely normal, you will receive your results only by: Marland Kitchen MyChart Message (if you have MyChart) OR . A paper copy in the mail If you have any lab test that is abnormal or we need to change your treatment, we will call you to review the results.  Follow-Up: At [facility], you and your health needs are our priority.  As part of our continuing mission to provide you with exceptional heart care, we have created designated Provider Care Teams.  These Care Teams include your primary Cardiologist (physician) and Advanced Practice Providers (APPs -  Physician Assistants and Nurse Practitioners) who all work together to provide you with the care you need, when you need it.  We recommend signing up for the patient portal called "MyChart".  Sign up information is provided on this After Visit Summary.  MyChart is used to connect with patients for Virtual Visits (Telemedicine).  Patients are able to view lab/test results, encounter notes, upcoming appointments, etc.  Non-urgent messages can be sent to your provider as well.   To learn more about what you can do with MyChart, go to [email or web address].    Your next appointment:   6 month(s)  The format for your next appointment:   In Person  Provider:   Oswaldo Milian, MD         Signed, Donato Heinz, MD  02/17/2020 2:46 PM    [place]

## 2020-02-17 ENCOUNTER — Other Ambulatory Visit: Payer: Self-pay

## 2020-02-17 ENCOUNTER — Encounter: Payer: Self-pay | Admitting: Cardiology

## 2020-02-17 ENCOUNTER — Ambulatory Visit: Payer: Medicare HMO | Admitting: Cardiology

## 2020-02-17 VITALS — BP 100/62 | HR 59 | Ht 70.0 in | Wt 158.2 lb

## 2020-02-17 DIAGNOSIS — E785 Hyperlipidemia, unspecified: Secondary | ICD-10-CM

## 2020-02-17 DIAGNOSIS — I480 Paroxysmal atrial fibrillation: Secondary | ICD-10-CM

## 2020-02-17 DIAGNOSIS — I2510 Atherosclerotic heart disease of native coronary artery without angina pectoris: Secondary | ICD-10-CM | POA: Diagnosis not present

## 2020-02-17 LAB — LIPID PANEL
Chol/HDL Ratio: 3.1 ratio (ref 0.0–5.0)
Cholesterol, Total: 142 mg/dL (ref 100–199)
HDL: 46 mg/dL (ref >39)
LDL Chol Calc (NIH): 77 mg/dL (ref 0–99)
Triglycerides: 105 mg/dL (ref 0–149)
VLDL Cholesterol Cal: 19 mg/dL (ref 5–40)

## 2020-02-17 NOTE — Patient Instructions (Signed)
Medication Instructions:  Your physician recommends that you continue on your current medications as directed. Please refer to the Current Medication list given to you today.  *If you need a refill on your cardiac medications before your next appointment, please call your pharmacy*   Lab Work: Lipid today  If you have labs (blood work) drawn today and your tests are completely normal, you will receive your results only by: . MyChart Message (if you have MyChart) OR . A paper copy in the mail If you have any lab test that is abnormal or we need to change your treatment, we will call you to review the results.   Follow-Up: At [facility], you and your health needs are our priority.  As part of our continuing mission to provide you with exceptional heart care, we have created designated Provider Care Teams.  These Care Teams include your primary Cardiologist (physician) and Advanced Practice Providers (APPs -  Physician Assistants and Nurse Practitioners) who all work together to provide you with the care you need, when you need it.  We recommend signing up for the patient portal called "MyChart".  Sign up information is provided on this After Visit Summary.  MyChart is used to connect with patients for Virtual Visits (Telemedicine).  Patients are able to view lab/test results, encounter notes, upcoming appointments, etc.  Non-urgent messages can be sent to your provider as well.   To learn more about what you can do with MyChart, go to [email or web address].    Your next appointment:   6 month(s)  The format for your next appointment:   In Person  Provider:   Christopher Schumann, MD    

## 2020-02-18 ENCOUNTER — Telehealth: Payer: Self-pay | Admitting: Cardiology

## 2020-02-18 ENCOUNTER — Other Ambulatory Visit: Payer: Self-pay | Admitting: *Deleted

## 2020-02-18 MED ORDER — EZETIMIBE 10 MG PO TABS
10.0000 mg | ORAL_TABLET | Freq: Every day | ORAL | 3 refills | Status: DC
Start: 2020-02-18 — End: 2020-02-18

## 2020-02-18 MED ORDER — EZETIMIBE 10 MG PO TABS: 10.0000 mg | ORAL_TABLET | Freq: Every day | ORAL | 3 refills | Status: DC

## 2020-02-18 NOTE — Telephone Encounter (Signed)
Patient calling to speak with [facility] again.

## 2020-02-18 NOTE — Telephone Encounter (Signed)
° °  Pt c/o medication issue:  1. Name of Medication:   ezetimibe (ZETIA) 10 MG tablet     2. How are you currently taking this medication (dosage and times per day)? Take 1 tablet (10 mg total) by mouth daily.  3. Are you having a reaction (difficulty breathing--STAT)?   4. What is your medication issue? Pt would like to know if this is a replacement for his rosuvastatin or he has to take both medication

## 2020-02-18 NOTE — Telephone Encounter (Signed)
Returned call to patient-he found more Eliquis from his trip to [place] (he got them filled sooner to take out of town).  He has enough and will not run out now.

## 2020-02-18 NOTE — Telephone Encounter (Signed)
Returned call to patient-advised he will take rosuvastatin and Zetia.   He also states he will run out of Eliquis before the pharmacy will fill it.    Advised he should not miss any doses of Eliquis and advised to call and discuss with pharmacy as they should be able to do a refill as long as he has not taken them incorrectly or lost some (causing to refill too soon).   He will call to discuss but is aware to call back Thursday or Friday if he continues to be unable to fill and will run out.

## 2020-02-27 ENCOUNTER — Telehealth: Payer: Self-pay | Admitting: Cardiology

## 2020-02-27 NOTE — Telephone Encounter (Signed)
STAT if HR is under 50 or over 120 (normal HR is 60-100 beats per minute)  1) What is your heart rate? 150 this morning about an hour and a half ago, 96 now  2) Do you have a log of your heart rate readings (document readings)? 58, 59  3) Do you have any other symptoms? No   Patient states his HR was 150 when he first woke up about an hour and a half ago. He states it is now 96 and his normal HR range has been around 58.

## 2020-02-27 NOTE — Telephone Encounter (Signed)
Spoke with pt, he woke this morning with a heart rate of 150 bpm. His kardia device says possible atrial fib. His bp is 101/62. Currently his heart rate is 96. The patient reports he feels fine with no symptoms. Advised the patient to take his morning mediations and if by this afternoon his heart rate4 is still elevated to let us know. Pt agreed with this plan.

## 2020-03-05 DIAGNOSIS — R69 Illness, unspecified: Secondary | ICD-10-CM | POA: Diagnosis not present

## 2020-03-24 DIAGNOSIS — R69 Illness, unspecified: Secondary | ICD-10-CM | POA: Diagnosis not present

## 2020-04-08 DIAGNOSIS — Z23 Encounter for immunization: Secondary | ICD-10-CM | POA: Diagnosis not present

## 2020-05-15 DIAGNOSIS — Z03818 Encounter for observation for suspected exposure to other biological agents ruled out: Secondary | ICD-10-CM | POA: Diagnosis not present

## 2020-05-15 DIAGNOSIS — Z20822 Contact with and (suspected) exposure to covid-19: Secondary | ICD-10-CM | POA: Diagnosis not present

## 2020-05-28 DIAGNOSIS — Z20822 Contact with and (suspected) exposure to covid-19: Secondary | ICD-10-CM | POA: Diagnosis not present

## 2020-06-15 DIAGNOSIS — E039 Hypothyroidism, unspecified: Secondary | ICD-10-CM | POA: Diagnosis not present

## 2020-06-15 DIAGNOSIS — E785 Hyperlipidemia, unspecified: Secondary | ICD-10-CM | POA: Diagnosis not present

## 2020-06-15 DIAGNOSIS — E559 Vitamin D deficiency, unspecified: Secondary | ICD-10-CM | POA: Diagnosis not present

## 2020-06-15 DIAGNOSIS — R739 Hyperglycemia, unspecified: Secondary | ICD-10-CM | POA: Diagnosis not present

## 2020-06-15 DIAGNOSIS — Z125 Encounter for screening for malignant neoplasm of prostate: Secondary | ICD-10-CM | POA: Diagnosis not present

## 2020-06-22 DIAGNOSIS — E785 Hyperlipidemia, unspecified: Secondary | ICD-10-CM | POA: Diagnosis not present

## 2020-06-22 DIAGNOSIS — I2584 Coronary atherosclerosis due to calcified coronary lesion: Secondary | ICD-10-CM | POA: Diagnosis not present

## 2020-06-22 DIAGNOSIS — M25561 Pain in right knee: Secondary | ICD-10-CM | POA: Diagnosis not present

## 2020-06-22 DIAGNOSIS — Z7901 Long term (current) use of anticoagulants: Secondary | ICD-10-CM | POA: Diagnosis not present

## 2020-06-22 DIAGNOSIS — E039 Hypothyroidism, unspecified: Secondary | ICD-10-CM | POA: Diagnosis not present

## 2020-06-22 DIAGNOSIS — K5730 Diverticulosis of large intestine without perforation or abscess without bleeding: Secondary | ICD-10-CM | POA: Diagnosis not present

## 2020-06-22 DIAGNOSIS — E559 Vitamin D deficiency, unspecified: Secondary | ICD-10-CM | POA: Diagnosis not present

## 2020-06-22 DIAGNOSIS — I480 Paroxysmal atrial fibrillation: Secondary | ICD-10-CM | POA: Diagnosis not present

## 2020-06-22 DIAGNOSIS — R739 Hyperglycemia, unspecified: Secondary | ICD-10-CM | POA: Diagnosis not present

## 2020-06-22 DIAGNOSIS — Z0000 Encounter for general adult medical examination without abnormal findings: Secondary | ICD-10-CM | POA: Diagnosis not present

## 2020-06-23 DIAGNOSIS — Z1212 Encounter for screening for malignant neoplasm of rectum: Secondary | ICD-10-CM | POA: Diagnosis not present

## 2020-07-10 DIAGNOSIS — Z20822 Contact with and (suspected) exposure to covid-19: Secondary | ICD-10-CM | POA: Diagnosis not present

## 2020-07-27 DIAGNOSIS — Z461 Encounter for fitting and adjustment of hearing aid: Secondary | ICD-10-CM | POA: Diagnosis not present

## 2020-07-29 ENCOUNTER — Encounter: Payer: Self-pay | Admitting: Cardiology

## 2020-07-29 ENCOUNTER — Other Ambulatory Visit: Payer: Self-pay

## 2020-07-29 ENCOUNTER — Ambulatory Visit: Payer: Medicare HMO | Admitting: Cardiology

## 2020-07-29 VITALS — BP 138/74 | HR 54 | Ht 70.0 in | Wt 162.0 lb

## 2020-07-29 DIAGNOSIS — I480 Paroxysmal atrial fibrillation: Secondary | ICD-10-CM | POA: Diagnosis not present

## 2020-07-29 DIAGNOSIS — E785 Hyperlipidemia, unspecified: Secondary | ICD-10-CM | POA: Diagnosis not present

## 2020-07-29 DIAGNOSIS — I2510 Atherosclerotic heart disease of native coronary artery without angina pectoris: Secondary | ICD-10-CM | POA: Diagnosis not present

## 2020-07-29 NOTE — Patient Instructions (Signed)
Medication Instructions:  Your Physician recommend you continue on your current medication as directed.    *If you need a refill on your cardiac medications before your next appointment, please call your pharmacy*   Lab Work: None   Testing/Procedures: None   Follow-Up: At [facility], you and your health needs are our priority.  As part of our continuing mission to provide you with exceptional heart care, we have created designated Provider Care Teams.  These Care Teams include your primary Cardiologist (physician) and Advanced Practice Providers (APPs -  Physician Assistants and Nurse Practitioners) who all work together to provide you with the care you need, when you need it.  We recommend signing up for the patient portal called "MyChart".  Sign up information is provided on this After Visit Summary.  MyChart is used to connect with patients for Virtual Visits (Telemedicine).  Patients are able to view lab/test results, encounter notes, upcoming appointments, etc.  Non-urgent messages can be sent to your provider as well.   To learn more about what you can do with MyChart, go to [email or web address].    Your next appointment:   6 month(s)  The format for your next appointment:   In Person  Provider:   Oswaldo Milian, MD

## 2020-07-29 NOTE — Progress Notes (Signed)
Cardiology Office Note:    Date:  07/29/2020   ID:  Julian, Crawford [birth year], MRN [id number]  PCP:  Julian Baton, MD  Cardiologist:  Julian Heinz, MD  Electrophysiologist:  None   Referring MD: Julian Baton, MD   Chief Complaint  Patient presents with  . Atrial Fibrillation    History of Present Illness:    Julian Crawford is a 70 y.o. male with a hx of CAD, hyperlipidemia who presents for follow-up.  He was referred by Dr. Virgina Crawford for evaluation of palpitations, initially seen on 10/15/2019.  Reported that in prior 6 months has had 3 episodes of palpitations, where felt like heart was racing.  Can last up to 8 hours.  Checks BP and very labile.  HR has not been elevated but often says "irregular" on BP monitor.  Reports that he walks 5 to 6 miles per day.  Reports that he has been having lightheadedness with walking.  Denies any chest pain but does report dyspnea with exertion.  No smoking history.  Denies any history of heart disease in his immediate family.  Labs on 10/02/19 showed TSH was normal.  Calcium score on 06/21/18 was 176 (55th percentile).    ETT on 10/25/2019 was high risk.  ST segment elevation of 2 mm in leads V1/2, in addition to horizontal/downsloping ST depressions in inferior and lateral leads.  TTE 10/29/2019 showed normal biventricular function, grade 1 diastolic dysfunction, mild MR, SAM with 26 mmHg LVOT gradient.  Cardiac catheterization was done on 12/01/2019, which showed 50% mid LAD stenosis (not significant by RFR) and 20% D2 and mid/distal RCA lesions.  Cardiac monitor on 11/20/2019 showed atrial fibrillation/flutter with average heart rate 114 bpm; total AF burden less than 1%.  Also with occasional PVCs (3% of beats)  Since last clinic visit,  he reports that he had an episode of atrial fibrillation on 1/24.  Reports he woke up that morning heart was racing.  States that initially heart rate is in the 150s but then came down to the 90s.  States that  he felt fairly asymptomatic though did note shortness of breath on walking.  Lasted most of the day but resolved by the end of the day.  Reports has had he thinks 4 occurrences of A. fib over the last 8 months.   Past Medical History:  Diagnosis Date  . Colon polyps   . Heart murmur   . Hyperlipidemia     Past Surgical History:  Procedure Laterality Date  . INTRAVASCULAR PRESSURE WIRE/FFR STUDY N/A 11/04/2019   Procedure: INTRAVASCULAR PRESSURE WIRE/FFR STUDY;  Surgeon: Nelva Bush, MD;  Location: [facility];  Service: Cardiovascular;  Laterality: N/A;  . LEFT HEART CATH AND CORONARY ANGIOGRAPHY N/A 11/04/2019   Procedure: LEFT HEART CATH AND CORONARY ANGIOGRAPHY;  Surgeon: Nelva Bush, MD;  Location: [facility];  Service: Cardiovascular;  Laterality: N/A;  . PILONIDAL CYST EXCISION    . TONSILLECTOMY      Current Medications: Current Meds  Medication Sig  . apixaban (ELIQUIS) 5 MG TABS tablet Take 1 tablet (5 mg total) by mouth 2 (two) times daily.  . cholecalciferol (VITAMIN D) 1000 units tablet Take 1,000 Units by mouth daily.  . metoprolol tartrate (LOPRESSOR) 25 MG tablet Take 0.5 tablets (12.5 mg total) by mouth 2 (two) times daily.  . multivitamin (THERAGRAN) per tablet Take 1 tablet by mouth daily.  . niacin (NIASPAN) 500 MG CR tablet Take 500 mg by  mouth daily.  . Omega-3 Fatty Acids (FISH OIL OMEGA-3 PO) Take 1,400 mg by mouth in the morning and at bedtime.  Marland Kitchen Propylene Glycol-Glycerin (SOOTHE) 0.6-0.6 % SOLN Place 1 drop into both eyes daily as needed.  . rosuvastatin (CRESTOR) 40 MG tablet Take 1 tablet (40 mg total) by mouth daily.  . vitamin C (ASCORBIC ACID) 500 MG tablet Take 500 mg by mouth daily.  Marland Kitchen zolpidem (AMBIEN) 5 MG tablet Take 5 mg by mouth at bedtime as needed for sleep.     Allergies:   Patient has no known allergies.   Social History   Socioeconomic History  . Marital status: Married    Spouse name: Not on file  . Number of  children: Not on file  . Years of education: Not on file  . Highest education level: Not on file  Occupational History  . Not on file  Tobacco Use  . Smoking status: Never Smoker  . Smokeless tobacco: Never Used  Substance and Sexual Activity  . Alcohol use: Yes    Alcohol/week: 7.0 - 14.0 standard drinks    Types: 7 - 14 Glasses of wine per week  . Drug use: No  . Sexual activity: Yes  Other Topics Concern  . Not on file  Social History Narrative  . Not on file   Social Determinants of Health   Financial Resource Strain: Not on file  Food Insecurity: Not on file  Transportation Needs: Not on file  Physical Activity: Not on file  Stress: Not on file  Social Connections: Not on file     Family History: The patient's family history includes Alzheimer's disease in his father; Dementia in his father and mother; Heart disease in his father; Hyperlipidemia in his mother. There is no history of Colon cancer.  ROS:   Please see the history of present illness.     All other systems reviewed and are negative.  EKGs/Labs/Other Studies Reviewed:    The following studies were reviewed today:   EKG:  EKG is ordered today.  The ekg ordered demonstrates normal sinus rhythm with PAC, rate 54, no ST/T abnormalities  ETT 10/25/19:  ST segment elevation of 2 mm was noted during stress in the V1 and V2 leads, beginning at 5 minutes of stress, and returning to baseline after 1-5 minutes of recovery.   High risk stress test, very concerning for ischemia. There are ST elevations in V1 and V2 with stage 2 exercise, as well as horizontal and downsloping ST depressions in the inferior and lateral leads. Findings communicated with Dr. Gardiner Rhyme.  Cath 11/04/19: Conclusions: 1. Mild to moderate, non-obstructive coronary artery disease, including 50% mid LAD stenosis (not hemodynamically significant by RFR) and 20% D2 and mid/distal RCA lesions. 2. Normal left ventricular filling  pressure.  Recommendations: 1. Medical therapy and risk factor modification to prevent progression of disease.    Cardiac monitor 11/20/19: 3 episodes of atrial fibrillation/atrial flutter with rates 92-163 (average 114 bpm). Less than 1% atrial fibrillation burden. Longest episode lasted 1 hour.  Occasional PVCs (3% of beats)   Predominant rhythm is sinus rhythm. Range is 56 to 163 bpm with average of 80 bpm.  3 episodes of atrial fibrillation/atrial flutter, longest lasting 1 hour.  Rates the 92-163 (average 114 bpm).  Less than 1% atrial fibrillation burden.   4 beat run of NSVT.  No sustained ventricular tachycardia, significant pause, or high degree AV block. Total ventricular ectopy 3%. 3 patient triggered events, corresponding to  sinus rhythm.  Recent Labs: 10/29/2019: BUN 11; Creatinine, Ser 0.79; Hemoglobin 14.1; Platelets 219; Potassium 4.8; Sodium 139  Recent Lipid Panel    Component Value Date/Time   CHOL 142 02/17/2020 0943   TRIG 105 02/17/2020 0943   HDL 46 02/17/2020 0943   CHOLHDL 3.1 02/17/2020 0943   CHOLHDL 5 05/27/2013 0811   VLDL 23.0 05/27/2013 0811   LDLCALC 77 02/17/2020 0943   LDLDIRECT 146.5 05/27/2013 0811    Physical Exam:    VS:  BP 138/74   Pulse (!) 54   Ht 5\' 10"  (1.778 m)   Wt 162 lb (73.5 kg)   BMI 23.24 kg/m     Wt Readings from Last 3 Encounters:  07/29/20 162 lb (73.5 kg)  02/17/20 158 lb 3.2 oz (71.8 kg)  11/28/19 157 lb 12.8 oz (71.6 kg)     GEN:  Well nourished, well developed in no acute distress HEENT: Normal NECK: No JVD CARDIAC: RRR, no murmurs, rubs, gallops RESPIRATORY:  Clear to auscultation without rales, wheezing or rhonchi  ABDOMEN: Soft, non-tender, non-distended MUSCULOSKELETAL:  No edema; No deformity  SKIN: Warm and dry NEUROLOGIC:  Alert and oriented x 3 PSYCHIATRIC:  Normal affect   ASSESSMENT:    1. Paroxysmal atrial fibrillation (HCC)   2. Coronary artery disease involving native coronary artery of  native heart without angina pectoris   3. Hyperlipidemia, unspecified hyperlipidemia type    PLAN:    Atrial fibrillation/flutter: Diagnosed on cardiac monitor on 11/20/2019 (<1% burden). CHA2DS2-VASc score 2 (age, CAD) -Continue metoprolol 12.5 mg twice daily -Continue Eliquis 5 mg twice daily -Reports about 4 occurrences of A. fib over the last 8 months.  Fairly asymptomatic during episodes.  Did report some elevated heart rates during recent episode.  Advised that he could take an extra dose of metoprolol as needed during episodes.   If increased frequency of episodes or worsening symptoms, consider antiarrhythmic medication versus ablation  CAD: Calcium score 176 (75th percentile) on 06/21/2018.  LDL 96 on 06/13/2019.  ETT high risk, with ST segment elevation of 2 mm in leads V1/2, in addition to horizontal/downsloping ST depressions in inferior and lateral leads.  Cath 11/04/19 showed 50% mid LAD stenosis (not significant by RFR) and 20% D2 and mid/distal RCA lesions.  TTE shows normal LV systolic function -Continue rosuvastatin 40 mg daily  -SL NTG prn   Hyperlipidemia: LDL 77 on 02/17/2020, on rosuvastatin 40 mg daily.  Zetia 10 mg daily was added   RTC in  6 months   Medication Adjustments/Labs and Tests Ordered: Current medicines are reviewed at length with the patient today.  Concerns regarding medicines are outlined above.  Orders Placed This Encounter  Procedures  . EKG 12-Lead   No orders of the defined types were placed in this encounter.   Patient Instructions  Medication Instructions:  Your Physician recommend you continue on your current medication as directed.    *If you need a refill on your cardiac medications before your next appointment, please call your pharmacy*   Lab Work: None   Testing/Procedures: None   Follow-Up: At [facility], you and your health needs are our priority.  As part of our continuing mission to provide you with exceptional heart  care, we have created designated Provider Care Teams.  These Care Teams include your primary Cardiologist (physician) and Advanced Practice Providers (APPs -  Physician Assistants and Nurse Practitioners) who all work together to provide you with the care you need, when you  need it.  We recommend signing up for the patient portal called "MyChart".  Sign up information is provided on this After Visit Summary.  MyChart is used to connect with patients for Virtual Visits (Telemedicine).  Patients are able to view lab/test results, encounter notes, upcoming appointments, etc.  Non-urgent messages can be sent to your provider as well.   To learn more about what you can do with MyChart, go to [email or web address].    Your next appointment:   6 month(s)  The format for your next appointment:   In Person  Provider:   Oswaldo Milian, MD       Signed, Julian Heinz, MD  07/29/2020 6:36 PM    [place]

## 2020-08-19 ENCOUNTER — Ambulatory Visit: Payer: Medicare HMO | Admitting: Cardiology

## 2020-08-20 MED ORDER — METOPROLOL TARTRATE 25 MG PO TABS: 12.5000 mg | ORAL_TABLET | Freq: Two times a day (BID) | ORAL | 3 refills | Status: DC

## 2020-09-21 ENCOUNTER — Other Ambulatory Visit: Payer: Self-pay

## 2020-09-21 ENCOUNTER — Encounter: Payer: Self-pay | Admitting: Family Medicine

## 2020-09-21 ENCOUNTER — Ambulatory Visit: Payer: Medicare HMO | Admitting: Family Medicine

## 2020-09-21 VITALS — BP 100/70 | Ht 70.0 in | Wt 155.0 lb

## 2020-09-21 DIAGNOSIS — M545 Low back pain, unspecified: Secondary | ICD-10-CM

## 2020-09-21 NOTE — Patient Instructions (Signed)
Your history and exam are consistent with a deep postural strain with some sciatica. Ok to take tylenol for baseline pain relief (1-2 extra strength tabs 3x/day) Consider prednisone, muscle relaxant in future. Stay as active as possible. Physical therapy has been shown to be helpful as well - start this and do home exercises on days you don't go to therapy. Strengthening of low back muscles, abdominal musculature are key for long term pain relief. If not improving, will consider further imaging (MRI). Follow up with me in 1 month to 6 weeks.

## 2020-09-21 NOTE — Progress Notes (Signed)
PCP: Shon Baton, MD  Subjective:   HPI: Patient is a 70 y.o. male here for low back pain.  Patient reports for the past 4 months he's had right sided low back pain. No acute injury or trauma. Feels worse getting out of car, better with rest and lying down. No numbness or tingling. Does radiate some down leg to posterior thigh. No bowel/bladder dysfunction. Exercises regularly.  Past Medical History:  Diagnosis Date  . Colon polyps   . Heart murmur   . Hyperlipidemia     Current Outpatient Medications on File Prior to Visit  Medication Sig Dispense Refill  . apixaban (ELIQUIS) 5 MG TABS tablet Take 1 tablet (5 mg total) by mouth 2 (two) times daily. 180 tablet 3  . cholecalciferol (VITAMIN D) 1000 units tablet Take 1,000 Units by mouth daily.    Marland Kitchen ezetimibe (ZETIA) 10 MG tablet Take 1 tablet (10 mg total) by mouth daily. 90 tablet 3  . metoprolol tartrate (LOPRESSOR) 25 MG tablet Take 0.5 tablets (12.5 mg total) by mouth 2 (two) times daily. 90 tablet 3  . multivitamin (THERAGRAN) per tablet Take 1 tablet by mouth daily.    . niacin (NIASPAN) 500 MG CR tablet Take 500 mg by mouth daily.    . nitroGLYCERIN (NITROSTAT) 0.4 MG SL tablet Place 1 tablet (0.4 mg total) under the tongue every 5 (five) minutes as needed for chest pain. 25 tablet 3  . Omega-3 Fatty Acids (FISH OIL OMEGA-3 PO) Take 1,400 mg by mouth in the morning and at bedtime.    Marland Kitchen Propylene Glycol-Glycerin (SOOTHE) 0.6-0.6 % SOLN Place 1 drop into both eyes daily as needed.    . rosuvastatin (CRESTOR) 40 MG tablet Take 1 tablet (40 mg total) by mouth daily. 90 tablet 3  . vitamin C (ASCORBIC ACID) 500 MG tablet Take 500 mg by mouth daily.    Marland Kitchen zolpidem (AMBIEN) 5 MG tablet Take 5 mg by mouth at bedtime as needed for sleep.    . [DISCONTINUED] atorvastatin (LIPITOR) 10 MG tablet Take 1 tablet (10 mg total) by mouth daily. 30 tablet 11   No current facility-administered medications on file prior to visit.    Past  Surgical History:  Procedure Laterality Date  . INTRAVASCULAR PRESSURE WIRE/FFR STUDY N/A 11/04/2019   Procedure: INTRAVASCULAR PRESSURE WIRE/FFR STUDY;  Surgeon: Nelva Bush, MD;  Location: [place] [facility];  Service: Cardiovascular;  Laterality: N/A;  . LEFT HEART CATH AND CORONARY ANGIOGRAPHY N/A 11/04/2019   Procedure: LEFT HEART CATH AND CORONARY ANGIOGRAPHY;  Surgeon: Nelva Bush, MD;  Location: [place] [facility];  Service: Cardiovascular;  Laterality: N/A;  . PILONIDAL CYST EXCISION    . TONSILLECTOMY      No Known Allergies  Social History   Socioeconomic History  . Marital status: Married    Spouse name: Not on file  . Number of children: Not on file  . Years of education: Not on file  . Highest education level: Not on file  Occupational History  . Not on file  Tobacco Use  . Smoking status: Never Smoker  . Smokeless tobacco: Never Used  Substance and Sexual Activity  . Alcohol use: Yes    Alcohol/week: 7.0 - 14.0 standard drinks    Types: 7 - 14 Glasses of wine per week  . Drug use: No  . Sexual activity: Yes  Other Topics Concern  . Not on file  Social History Narrative  . Not on file   Social  Determinants of Health   Financial Resource Strain: Not on file  Food Insecurity: Not on file  Transportation Needs: Not on file  Physical Activity: Not on file  Stress: Not on file  Social Connections: Not on file  Intimate Partner Violence: Not on file    Family History  Problem Relation Age of Onset  . Hyperlipidemia Mother   . Dementia Mother   . Dementia Father   . Alzheimer's disease Father   . Heart disease Father   . Colon cancer Neg Hx     BP 100/70   Ht 5\' 10"  (1.778 m)   Wt 155 lb (70.3 kg)   BMI 22.24 kg/m   No flowsheet data found.  No flowsheet data found.  Review of Systems: See HPI above.     Objective:  Physical Exam:  Gen: NAD, comfortable in exam room  Back: No gross deformity, scoliosis. TTP right lumbar  paraspinal region.  No midline or bony TTP. FROM without pain. Strength LEs 5/5 all muscle groups.   2+ MSRs in patellar and achilles tendons, equal bilaterally. Negative SLRs. Sensation intact to light touch bilaterally.   Assessment & Plan:  1. Low back pain - right sided.  Consistent with postural strain with sciatica vs lumbar radiculopathy.  Tylenol as needed.  Start formal physical therapy, home exercises.  Consider MRI if not improving.  F/u in 1 month to 6 weeks.

## 2020-10-07 ENCOUNTER — Encounter: Payer: Self-pay | Admitting: Physical Therapy

## 2020-10-07 ENCOUNTER — Ambulatory Visit: Payer: Medicare HMO | Attending: Family Medicine | Admitting: Physical Therapy

## 2020-10-07 ENCOUNTER — Other Ambulatory Visit: Payer: Self-pay

## 2020-10-07 DIAGNOSIS — G8929 Other chronic pain: Secondary | ICD-10-CM | POA: Diagnosis not present

## 2020-10-07 DIAGNOSIS — M25561 Pain in right knee: Secondary | ICD-10-CM | POA: Insufficient documentation

## 2020-10-07 DIAGNOSIS — M545 Low back pain, unspecified: Secondary | ICD-10-CM | POA: Insufficient documentation

## 2020-10-07 NOTE — Therapy (Signed)
[place], [place], [address] Phone: [phone]   Fax:  [phone]  Physical Therapy Evaluation  Patient Details  Name: Julian Crawford MRN: [id number] Date of Birth: 05-15-1951 Referring Provider (PT): Dene Gentry, MD   Encounter Date: 10/07/2020   PT End of Session - 10/07/20 0859    Visit Number 1    Number of Visits 6    Date for PT Re-Evaluation 11/25/20    Authorization Type MCR: Kx mod at 15th, FOTO 6th and 10th visit    Progress Note Due on Visit 10    PT Start Time 0845    PT Stop Time 0930    PT Time Calculation (min) 45 min    Activity Tolerance Patient tolerated treatment well    Behavior During Therapy [facility] for tasks assessed/performed           Past Medical History:  Diagnosis Date  . A-fib ([place])   . Colon polyps   . Heart murmur   . Hyperlipidemia     Past Surgical History:  Procedure Laterality Date  . INTRAVASCULAR PRESSURE WIRE/FFR STUDY N/A 11/04/2019   Procedure: INTRAVASCULAR PRESSURE WIRE/FFR STUDY;  Surgeon: Nelva Bush, MD;  Location: [place] [facility];  Service: Cardiovascular;  Laterality: N/A;  . LEFT HEART CATH AND CORONARY ANGIOGRAPHY N/A 11/04/2019   Procedure: LEFT HEART CATH AND CORONARY ANGIOGRAPHY;  Surgeon: Nelva Bush, MD;  Location: [place] [facility];  Service: Cardiovascular;  Laterality: N/A;  . PILONIDAL CYST EXCISION    . TONSILLECTOMY      There were no vitals filed for this visit.    Subjective Assessment - 10/07/20 0850    Subjective pt is a 70 y.o M with CC of R low back pain that started about 6 months ago with no specific onset or MOI. He reports he is in the middle of moving and had to do alot of carrying which aggrivated the back. Since onset he reports the back is improving but the R knee seems to be getting worse especially in the back of the knee. He denies any red flags. He reports hx of R knee pain last year which resolved  in 2-3 weeks with exercise.    How long can you sit comfortably? unlimited    How long can you stand comfortably? unlimited    How long can you walk comfortably? unlimited    Diagnostic tests N/A    Patient Stated Goals to know about exercises and stretchs to calm pain    Currently in Pain? Yes    Pain Score 0-No pain   at worst 7/10   Pain Location Knee    Pain Orientation Right    Pain Descriptors / Indicators Aching    Pain Type Chronic pain    Pain Onset More than a month ago    Pain Frequency Intermittent    Aggravating Factors  walking down hill    Pain Relieving Factors stopping and resting    Effect of Pain on Daily Activities limited downhil walking    Multiple Pain Sites Yes    Pain Score 0   at worst 3/10   Pain Location Back    Pain Orientation Right    Pain Descriptors / Indicators Aching    Pain Type Chronic pain    Pain Onset More than a month ago    Pain Frequency Intermittent    Aggravating Factors  bending forward    Pain Relieving  Factors getting out of positoion    Effect of Pain on Daily Activities limited ROM              [facility] PT Assessment - 10/07/20 0844      Assessment   Medical Diagnosis Bilateral low back pain without sciatica, unspecified chronicity (M54.50)    Referring Provider (PT) Dene Gentry, MD    Onset Date/Surgical Date --   November 2021 for back, Feb 2022 R knee   Hand Dominance Right    Next MD Visit May 2    Prior Therapy no      Precautions   Precautions None      Restrictions   Weight Bearing Restrictions No      Balance Screen   Has the patient fallen in the past 6 months No      [place] residence    Living Arrangements Spouse/significant other    Available Help at Discharge Family    Type of [place] to enter    Entrance Stairs-Number of Steps 5    Entrance Stairs-Rails Right   ascending   Home Layout Two level    Alternate Level Stairs-Number  of Steps 16    Alternate Level Stairs-Rails Right;Left   ascending     Prior Function   Level of Independence Independent    Vocation Retired      Observation/Other Assessments   Focus on Therapeutic Outcomes (FOTO)  65%   predcited 75%     Posture/Postural Control   Posture/Postural Control Postural limitations    Postural Limitations Forward head      ROM / Strength   AROM / PROM / Strength AROM;Strength      AROM   AROM Assessment Site Lumbar    Lumbar Flexion 68   concordant symptoms at end range   Lumbar Extension 20    Lumbar - Right Side Bend 18    Lumbar - Left Side Bend 20      Strength   Strength Assessment Site Hip;Knee    Right/Left Hip Right;Left    Right Hip Flexion 4+/5    Right Hip ABduction 4/5    Left Hip Flexion 4+/5    Left Hip ABduction 4/5    Right/Left Knee Right;Left    Right Knee Flexion 4+/5   pain in the back of the knee during testing   Right Knee Extension 5/5    Left Knee Flexion 5/5    Left Knee Extension 5/5      Palpation   Palpation comment TTP along the R SIJ, and R knee medial joint line and distal semi-tendionsus      Special Tests   Sacroiliac Tests  Gaenslen's Test   thigh thrust (+), and forward flexion test (+)     Gaenslen's test   Findings Positive    Side  Right                      Objective measurements completed on examination: See above findings.       [facility] Adult PT Treatment/Exercise - 10/07/20 0844      Lumbar Exercises: Stretches   Active Hamstring Stretch 3 reps;Right;30 seconds   PNF contract / realx   Passive Hamstring Stretch Right;30 seconds   seated   Other Lumbar Stretch Exercise low back stretch seated walking hands down legs 2 x 30      Lumbar Exercises: Supine  Single Leg Bridge 10 reps   RLE x 2 sets     Manual Therapy   Manual Therapy Joint mobilization    Joint Mobilization LAD grade IV RLE only                  PT Education - 10/07/20 0858    Education Details  evaluation findings, POC, goals, HEP with proper form. FOTO assessment    Person(s) Educated Patient    Methods Explanation;Verbal cues;Handout    Comprehension Verbalized understanding;Verbal cues required            PT Short Term Goals - 10/07/20 0938      PT SHORT TERM GOAL #1   Title pt to be IND with inital HEP    Time 2    Period Weeks    Status New    Target Date 11/04/20             PT Long Term Goals - 10/07/20 0938      PT LONG TERM GOAL #1   Title pt to increase trunk flexion and R sidebending to [facility] with </= 2/10 max pain for funcitonal ROM required for ADLs    Time 4    Period Weeks    Status New    Target Date 11/25/20      PT LONG TERM GOAL #2   Title pt to verbalize and demo efficient posture and lifting mechanics to reduce and prevent low back/ L knee pain    Time 4    Period Weeks    Status New    Target Date 11/25/20      PT LONG TERM GOAL #3   Title pt to be able to perform household activitys and walking per pt goals with no report of pain or limitations    Time 4    Period Weeks    Status New    Target Date 11/25/20      PT LONG TERM GOAL #4   Title increase FOTO score to to >/= 75% to demo improvement in functoin    Time 4    Period Weeks    Status New    Target Date 11/25/20      PT LONG TERM GOAL #5   Title pt to be IND with all HEP given and is able to maintain and progress current LOF IND    Time 4    Period Weeks    Status New    Target Date 11/25/20                  Plan - 10/07/20 0947    Clinical Impression Statement pt is a pleasant 70 y.o m presenting to [place] with CC of R low back starting 6 months ago with on specific MOI and notes concurrent R knee pain starting Feburary. He has functional trunk mobility with mild limitation noted with flexion and R sidebending with concordant symptoms. TTP along the R SIJ, and R knee medial joint line and distal semi-tendionsus. special testing is consistent for SIJ pathology  and he responsed well with hamstring stretch and hip flexor activation which he noted relief of tension with trunk flexion testing following treatment. He would beneift from physical therapy to decreased R low back pain, increase trunk mobility, improve posture and lifting mechanics, and maximize his function by addressing the deficits listed.    Stability/Clinical Decision Making Stable/Uncomplicated    Clinical Decision Making Low    PT Frequency 1x /  week    PT Duration 4 weeks    PT Treatment/Interventions ADLs/Self Care Home Management;Electrical Stimulation;Moist Heat;Ultrasound;Gait training;Functional mobility training;Therapeutic activities;Therapeutic exercise;Balance training;Neuromuscular re-education;Patient/family education;Manual techniques;Passive range of motion;Dry needling;Taping    PT Next Visit Plan review / update HEP PRN, potential SIJ involvement on the R, hamstring stretching, SLR exercise, core activation, hows R knee pain with hamstring stretching? how was [place] trip?    PT Home Exercise Plan [id number]- hamstring stretch seated, SLR, sidelying hipo abduction, seated low back stretch seated, LTR    Consulted and Agree with Plan of Care Patient           Patient will benefit from skilled therapeutic intervention in order to improve the following deficits and impairments:  Improper body mechanics,Increased muscle spasms,Decreased strength,Postural dysfunction,Pain,Decreased endurance  Visit Diagnosis: Chronic right-sided low back pain without sciatica  Chronic pain of right knee     Problem List Patient Active Problem List   Diagnosis Date Noted  . Abnormal stress test   . Rosacea blepharoconjunctivitis 05/17/2012  . ABDOMINAL PAIN, UNSPECIFIED SITE 07/30/2010  . HEMANGIOMA OF SKIN AND SUBCUTANEOUS TISSUE 05/07/2009  . ACTINIC KERATOSIS, HEAD 05/07/2009  . HYPERLIPIDEMIA 12/19/2008  . COLONIC POLYPS, HX OF 12/19/2008    Starr Lake PT, DPT, LAT, ATC   10/07/20  10:07 AM      [facility] [facility] [address] [place], [place], [address] Phone: [phone]   Fax:  [phone]  Name: Julian Crawford MRN: [id number] Date of Birth: 12-17-1950

## 2020-10-27 ENCOUNTER — Other Ambulatory Visit: Payer: Self-pay | Admitting: Cardiology

## 2020-10-27 NOTE — Telephone Encounter (Signed)
20m, 70.3kg, scr 0.79(10/29/19), lovw/schumann(07/29/20).

## 2020-11-02 ENCOUNTER — Ambulatory Visit: Payer: Medicare HMO | Admitting: Family Medicine

## 2020-11-02 ENCOUNTER — Other Ambulatory Visit: Payer: Self-pay

## 2020-11-02 VITALS — BP 100/60 | Ht 70.0 in

## 2020-11-02 DIAGNOSIS — G8929 Other chronic pain: Secondary | ICD-10-CM

## 2020-11-02 DIAGNOSIS — M25561 Pain in right knee: Secondary | ICD-10-CM

## 2020-11-02 DIAGNOSIS — M545 Low back pain, unspecified: Secondary | ICD-10-CM | POA: Diagnosis not present

## 2020-11-02 NOTE — Patient Instructions (Signed)
Your knee pain is due to arthritis. These are the different medications you can take for this: Tylenol 500mg  1-2 tabs three times a day for pain. Capsaicin, aspercreme, or biofreeze topically up to four times a day may also help with pain. Some supplements that may help for arthritis: Boswellia extract, curcumin, pycnogenol Aleve 1-2 tabs twice a day with food if needed Cortisone injections are an option if pain is severe. It's important that you continue to stay active. Straight leg raises, knee extensions 3 sets of 10 once a day (add ankle weight if these become too easy). Consider physical therapy to strengthen muscles around the joint that hurts to take pressure off of the joint itself. Shoe inserts with good arch support may be helpful. Heat or ice 15 minutes at a time 3-4 times a day as needed to help with pain. Water aerobics and cycling with low resistance are the best two types of exercise for arthritis though any exercise is ok as long as it doesn't worsen the pain. Follow up with me as needed. Dr. Lennie Muckle is the doctor I'd recommend in [facility] if you have any ortho/sports med issues - good luck on the move!

## 2020-11-02 NOTE — Progress Notes (Signed)
PCP: Shon Baton, MD  Subjective:   HPI: Patient is a 70 y.o. male here for follow on low back pain and chronic right knee pain.  Low back pain f/u Improved from last visit. Continues to do PT exercises. Next session tomorrow. Has had one previous session. No other concerns.   Right knee pain Intermittent over the last year. Now with pain on medial side of right knee. Admits to upcoming move to [place] and has been using stairs more frequently. Massaging the area makes it feel better. Worse when sitting for extended amount of time and rising from seated position. Denies any trauma to area.  Past Medical History:  Diagnosis Date  . A-fib ([place])   . Colon polyps   . Heart murmur   . Hyperlipidemia     Current Outpatient Medications on File Prior to Visit  Medication Sig Dispense Refill  . cholecalciferol (VITAMIN D) 1000 units tablet Take 1,000 Units by mouth daily.    Marland Kitchen ELIQUIS 5 MG TABS tablet TAKE 1 TABLET BY MOUTH TWICE A DAY 180 tablet 1  . ezetimibe (ZETIA) 10 MG tablet Take 1 tablet (10 mg total) by mouth daily. 90 tablet 3  . metoprolol tartrate (LOPRESSOR) 25 MG tablet Take 0.5 tablets (12.5 mg total) by mouth 2 (two) times daily. 90 tablet 3  . multivitamin (THERAGRAN) per tablet Take 1 tablet by mouth daily.    . niacin (NIASPAN) 500 MG CR tablet Take 500 mg by mouth daily.    . nitroGLYCERIN (NITROSTAT) 0.4 MG SL tablet Place 1 tablet (0.4 mg total) under the tongue every 5 (five) minutes as needed for chest pain. 25 tablet 3  . Omega-3 Fatty Acids (FISH OIL OMEGA-3 PO) Take 1,400 mg by mouth in the morning and at bedtime.    Marland Kitchen Propylene Glycol-Glycerin (SOOTHE) 0.6-0.6 % SOLN Place 1 drop into both eyes daily as needed.    . rosuvastatin (CRESTOR) 40 MG tablet Take 1 tablet (40 mg total) by mouth daily. 90 tablet 3  . vitamin C (ASCORBIC ACID) 500 MG tablet Take 500 mg by mouth daily.    Marland Kitchen zolpidem (AMBIEN) 5 MG tablet Take 5 mg by mouth at bedtime as needed for sleep.     . [DISCONTINUED] atorvastatin (LIPITOR) 10 MG tablet Take 1 tablet (10 mg total) by mouth daily. 30 tablet 11   No current facility-administered medications on file prior to visit.    Past Surgical History:  Procedure Laterality Date  . INTRAVASCULAR PRESSURE WIRE/FFR STUDY N/A 11/04/2019   Procedure: INTRAVASCULAR PRESSURE WIRE/FFR STUDY;  Surgeon: Nelva Bush, MD;  Location: [place] [facility];  Service: Cardiovascular;  Laterality: N/A;  . LEFT HEART CATH AND CORONARY ANGIOGRAPHY N/A 11/04/2019   Procedure: LEFT HEART CATH AND CORONARY ANGIOGRAPHY;  Surgeon: Nelva Bush, MD;  Location: [place] [facility];  Service: Cardiovascular;  Laterality: N/A;  . PILONIDAL CYST EXCISION    . TONSILLECTOMY      No Known Allergies  Social History   Socioeconomic History  . Marital status: Married    Spouse name: Not on file  . Number of children: Not on file  . Years of education: Not on file  . Highest education level: Not on file  Occupational History  . Not on file  Tobacco Use  . Smoking status: Never Smoker  . Smokeless tobacco: Never Used  Substance and Sexual Activity  . Alcohol use: Yes    Alcohol/week: 7.0 - 14.0 standard drinks  Types: 7 - 14 Glasses of wine per week  . Drug use: No  . Sexual activity: Yes  Other Topics Concern  . Not on file  Social History Narrative  . Not on file   Social Determinants of Health   Financial Resource Strain: Not on file  Food Insecurity: Not on file  Transportation Needs: Not on file  Physical Activity: Not on file  Stress: Not on file  Social Connections: Not on file  Intimate Partner Violence: Not on file    Family History  Problem Relation Age of Onset  . Hyperlipidemia Mother   . Dementia Mother   . Dementia Father   . Alzheimer's disease Father   . Heart disease Father   . Colon cancer Neg Hx     BP 100/60   Ht 5\' 10"  (1.778 m)   BMI 22.24 kg/m   No flowsheet data found.  No flowsheet data  found.  Review of Systems: See HPI above.     Objective:  Physical Exam:  Gen: NAD, comfortable in exam room No gross deformity, ecchymoses, swelling. TTP over medial joint space. FROM with normal strength. Negative ant/post drawers. Negative valgus/varus testing. Negative lachman. Negative mcmurrays, apleys. NV intact distally.    Assessment & Plan:  1. Chronic low back pain Symptomatic improvement with physical therapy.  -Continue PT as needed -Follow up if symptoms return  2. Chronic right knee pain Prior history of posterior knee pain. Pain localized to medial joint space and exam suggestive of arthritic changes contributing to pain. -Consider oral analgesics/anti-inflammatory -Suggestion of topical analgesics/anti-inflammatory -Home exercises to strengthen muscles surrounding knee joint -Follow up as needed or if symptoms worsen/fail to improve

## 2020-11-03 ENCOUNTER — Other Ambulatory Visit: Payer: Self-pay

## 2020-11-03 ENCOUNTER — Ambulatory Visit: Payer: Medicare HMO | Attending: Family Medicine | Admitting: Physical Therapy

## 2020-11-03 ENCOUNTER — Encounter: Payer: Self-pay | Admitting: Physical Therapy

## 2020-11-03 ENCOUNTER — Encounter: Payer: Self-pay | Admitting: Family Medicine

## 2020-11-03 DIAGNOSIS — M25561 Pain in right knee: Secondary | ICD-10-CM | POA: Insufficient documentation

## 2020-11-03 DIAGNOSIS — M545 Low back pain, unspecified: Secondary | ICD-10-CM | POA: Diagnosis not present

## 2020-11-03 DIAGNOSIS — G8929 Other chronic pain: Secondary | ICD-10-CM

## 2020-11-03 NOTE — Therapy (Signed)
[place], [place], [address] Phone: [phone]   Fax:  [phone]  Physical Therapy Treatment  Patient Details  Name: Julian Crawford MRN: [id number] Date of Birth: May 04, 1951 Referring Provider (PT): Dene Gentry, MD   Encounter Date: 11/03/2020   PT End of Session - 11/03/20 0932    Visit Number 2    Number of Visits 6    Date for PT Re-Evaluation 11/25/20    Authorization Type MCR: Kx mod at 15th, FOTO 6th and 10th visit    Progress Note Due on Visit 10    PT Start Time 0931    PT Stop Time 1014    PT Time Calculation (min) 43 min    Activity Tolerance Patient tolerated treatment well    Behavior During Therapy [facility] for tasks assessed/performed           Past Medical History:  Diagnosis Date  . A-fib ([place])   . Colon polyps   . Heart murmur   . Hyperlipidemia     Past Surgical History:  Procedure Laterality Date  . INTRAVASCULAR PRESSURE WIRE/FFR STUDY N/A 11/04/2019   Procedure: INTRAVASCULAR PRESSURE WIRE/FFR STUDY;  Surgeon: Nelva Bush, MD;  Location: [place] [facility];  Service: Cardiovascular;  Laterality: N/A;  . LEFT HEART CATH AND CORONARY ANGIOGRAPHY N/A 11/04/2019   Procedure: LEFT HEART CATH AND CORONARY ANGIOGRAPHY;  Surgeon: Nelva Bush, MD;  Location: [facility];  Service: Cardiovascular;  Laterality: N/A;  . PILONIDAL CYST EXCISION    . TONSILLECTOMY      There were no vitals filed for this visit.   Subjective Assessment - 11/03/20 0935    Subjective " the back is better, I am still ahving issue in the knee on the inside. I can do all my activitys it is very painfull at times. "    Patient Stated Goals to know about exercises and stretchs to calm pain    Currently in Pain? Yes    Pain Score 0-No pain   at worst 7/10   Pain Location Knee    Pain Orientation Right    Pain Descriptors / Indicators Aching    Pain Onset More than a month ago    Pain  Frequency Intermittent    Aggravating Factors  walking down hill    Pain Score 3    Pain Location Back    Pain Orientation Right    Pain Descriptors / Indicators Aching;Sore    Aggravating Factors  getting up from a seated position    Pain Relieving Factors getting              [facility] PT Assessment - 11/03/20 0001      Assessment   Medical Diagnosis Bilateral low back pain without sciatica, unspecified chronicity (M54.50)    Referring Provider (PT) Barbaraann Barthel, Sharyn Lull, MD                         [facility] Adult PT Treatment/Exercise - 11/03/20 0001      Lumbar Exercises: Stretches   Active Hamstring Stretch 4 reps;30 seconds   PNF contract/ relax     Lumbar Exercises: Supine   Dead Bug 5 reps   15 sec hold   Dead Bug Limitations verbal cues for proper form    Single Leg Bridge 20 reps   with focus on eccentric loading     Lumbar Exercises: Prone   Single Arm  Raise 10 reps;Left;Right   verbal cues for keeping arms straight   Straight Leg Raise 10 reps   x 2 sets   Opposite Arm/Leg Raise Right arm/Left leg;Left arm/Right leg;10 reps      Knee/Hip Exercises: Seated   Sit to Sand 2 sets;without UE support   with red theraband around knees to promote glue med activation and reducec medial collapse     Manual Therapy   Manual therapy comments MTPR along the proximal hamstrings x 4                  PT Education - 11/03/20 1018    Education Details reviewed HEP updated today for sit stand with band around knees    Person(s) Educated Patient    Methods Explanation;Verbal cues;Handout    Comprehension Verbalized understanding;Verbal cues required            PT Short Term Goals - 11/03/20 1040      PT SHORT TERM GOAL #1   Title pt to be IND with inital HEP    Period Weeks    Status Achieved    Target Date 11/04/20             PT Long Term Goals - 10/07/20 0938      PT LONG TERM GOAL #1   Title pt to increase trunk flexion and R sidebending to  [facility] with </= 2/10 max pain for funcitonal ROM required for ADLs    Time 4    Period Weeks    Status New    Target Date 11/25/20      PT LONG TERM GOAL #2   Title pt to verbalize and demo efficient posture and lifting mechanics to reduce and prevent low back/ L knee pain    Time 4    Period Weeks    Status New    Target Date 11/25/20      PT LONG TERM GOAL #3   Title pt to be able to perform household activitys and walking per pt goals with no report of pain or limitations    Time 4    Period Weeks    Status New    Target Date 11/25/20      PT LONG TERM GOAL #4   Title increase FOTO score to to >/= 75% to demo improvement in functoin    Time 4    Period Weeks    Status New    Target Date 11/25/20      PT LONG TERM GOAL #5   Title pt to be IND with all HEP given and is able to maintain and progress current LOF IND    Time 4    Period Weeks    Status New    Target Date 11/25/20                 Plan - 11/03/20 1024    Clinical Impression Statement pt returns since his last attended visit due bein [place] vacation for the last 3 weeks returning on 11/01/2020. He reports consistency with his HEP and notes improvement in back pain but conintues to report medial R knee pain. He demonstrates tenderness along the medial tibiofemoral joint line suggesting potential interal derangement. focused on hamstring stretching which he demonstrates significant tension and limation. continued working hip strengthening and core activation in prone. end of session he noted decreased tension.    PT Treatment/Interventions ADLs/Self Care Home Management;Electrical Stimulation;Moist Heat;Ultrasound;Gait training;Functional mobility training;Therapeutic activities;Therapeutic exercise;Balance training;Neuromuscular re-education;Patient/family  education;Manual techniques;Passive range of motion;Dry needling;Taping    PT Next Visit Plan review / update HEP PRN, monitor for potential SIJ on R hamstring  stretching, SLR exercise, core activation,  focus on quad and glute strengthening,    PT Home Exercise Plan [id number]- hamstring stretch seated, SLR, sidelying hipo abduction, seated low back stretch seated, LTR    Consulted and Agree with Plan of Care Patient           Patient will benefit from skilled therapeutic intervention in order to improve the following deficits and impairments:  Improper body mechanics,Increased muscle spasms,Decreased strength,Postural dysfunction,Pain,Decreased endurance  Visit Diagnosis: Chronic right-sided low back pain without sciatica  Chronic pain of right knee     Problem List Patient Active Problem List   Diagnosis Date Noted  . Abnormal stress test   . Rosacea blepharoconjunctivitis 05/17/2012  . ABDOMINAL PAIN, UNSPECIFIED SITE 07/30/2010  . HEMANGIOMA OF SKIN AND SUBCUTANEOUS TISSUE 05/07/2009  . ACTINIC KERATOSIS, HEAD 05/07/2009  . HYPERLIPIDEMIA 12/19/2008  . COLONIC POLYPS, HX OF 12/19/2008    Starr Lake PT, DPT, LAT, ATC  11/03/20  10:46 AM      [facility] [address] [place], [place], [address] Phone: [phone]   Fax:  [phone]  Name: Julian Crawford MRN: [id number] Date of Birth: 01/24/1951

## 2020-11-10 ENCOUNTER — Ambulatory Visit: Payer: Medicare HMO | Admitting: Physical Therapy

## 2020-11-11 ENCOUNTER — Other Ambulatory Visit: Payer: Self-pay

## 2020-11-11 ENCOUNTER — Encounter: Payer: Self-pay | Admitting: Physical Therapy

## 2020-11-11 ENCOUNTER — Ambulatory Visit: Payer: Medicare HMO | Admitting: Physical Therapy

## 2020-11-11 DIAGNOSIS — M25561 Pain in right knee: Secondary | ICD-10-CM

## 2020-11-11 DIAGNOSIS — G8929 Other chronic pain: Secondary | ICD-10-CM | POA: Diagnosis not present

## 2020-11-11 DIAGNOSIS — M545 Low back pain, unspecified: Secondary | ICD-10-CM

## 2020-11-11 NOTE — Therapy (Signed)
[place], [place], [address] Phone: [phone]   Fax:  [phone]  Physical Therapy Treatment / Discharge  Patient Details  Name: Julian Crawford MRN: [id number] Date of Birth: 08/08/1950 Referring Provider (PT): Dene Gentry, MD   Encounter Date: 11/11/2020   PT End of Session - 11/11/20 1547    Visit Number 3    Number of Visits 6    Date for PT Re-Evaluation 11/25/20    Authorization Type MCR: Kx mod at 15th, FOTO 6th and 10th visit    Progress Note Due on Visit 10    PT Start Time 1547    PT Stop Time 1627    PT Time Calculation (min) 40 min    Activity Tolerance Patient tolerated treatment well    Behavior During Therapy [facility] for tasks assessed/performed           Past Medical History:  Diagnosis Date  . A-fib ([place])   . Colon polyps   . Heart murmur   . Hyperlipidemia     Past Surgical History:  Procedure Laterality Date  . INTRAVASCULAR PRESSURE WIRE/FFR STUDY N/A 11/04/2019   Procedure: INTRAVASCULAR PRESSURE WIRE/FFR STUDY;  Surgeon: Nelva Bush, MD;  Location: [place] [facility];  Service: Cardiovascular;  Laterality: N/A;  . LEFT HEART CATH AND CORONARY ANGIOGRAPHY N/A 11/04/2019   Procedure: LEFT HEART CATH AND CORONARY ANGIOGRAPHY;  Surgeon: Nelva Bush, MD;  Location: [place] [facility];  Service: Cardiovascular;  Laterality: N/A;  . PILONIDAL CYST EXCISION    . TONSILLECTOMY      There were no vitals filed for this visit.   Subjective Assessment - 11/11/20 1548    Subjective "I am doing pretty good, the knee seems to be the worst with bending forward and raching down.    Patient Stated Goals to know about exercises and stretchs to calm pain    Currently in Pain? No/denies    Aggravating Factors  getting up from a seated position for a long period of time    Pain Relieving Factors stopp              [facility] PT Assessment - 11/11/20 0001      Assessment    Medical Diagnosis Bilateral low back pain without sciatica, unspecified chronicity (M54.50)    Referring Provider (PT) Barbaraann Barthel, Sharyn Lull, MD      Observation/Other Assessments   Focus on Therapeutic Outcomes (FOTO)  63%                         [facility] Adult PT Treatment/Exercise - 11/11/20 0001      Knee/Hip Exercises: Stretches   Passive Hamstring Stretch Right;1 rep;30 seconds      Knee/Hip Exercises: Standing   Step Down Both;1 set;Step Height: 6";10 reps    Stairs 6 x up/down 6 inch steps cues for proper from   pt would laterally rotate both legs when descending the stairs.     Knee/Hip Exercises: Seated   Long Arc Quad 2 sets;20 reps   4# 2nd set with ball squeeze     Knee/Hip Exercises: Supine   Single Leg Bridge 2 sets;10 reps      Knee/Hip Exercises: Sidelying   Hip ABduction Left;1 set;10 reps   4#                   PT Short Term Goals - 11/03/20 1040  PT SHORT TERM GOAL #1   Title pt to be IND with inital HEP    Period Weeks    Status Achieved    Target Date 11/04/20             PT Long Term Goals - 10/07/20 0938      PT LONG TERM GOAL #1   Title pt to increase trunk flexion and R sidebending to [facility] with </= 2/10 max pain for funcitonal ROM required for ADLs    Time 4    Period Weeks    Status New    Target Date 11/25/20      PT LONG TERM GOAL #2   Title pt to verbalize and demo efficient posture and lifting mechanics to reduce and prevent low back/ L knee pain    Time 4    Period Weeks    Status New    Target Date 11/25/20      PT LONG TERM GOAL #3   Title pt to be able to perform household activitys and walking per pt goals with no report of pain or limitations    Time 4    Period Weeks    Status New    Target Date 11/25/20      PT LONG TERM GOAL #4   Title increase FOTO score to to >/= 75% to demo improvement in functoin    Time 4    Period Weeks    Status New    Target Date 11/25/20      PT LONG TERM GOAL #5    Title pt to be IND with all HEP given and is able to maintain and progress current LOF IND    Time 4    Period Weeks    Status New    Target Date 11/25/20                 Plan - 11/11/20 1633    Clinical Impression Statement pt arrives to session reporting the pain in the back has progressively improved but notes the pain seems to be flucutating inthe R knee. he reports today will be his last day because he is moving to [place], he plans to resume PT over there after moving. continued working hip /knee strengthening and worked on stair trainin to [employer] on the knee which she required demonstration/ cues for. end of session he reported feeling alittle better. reivewed HEp and updated today, pt will be discharged from PT today.    PT Treatment/Interventions ADLs/Self Care Home Management;Electrical Stimulation;Moist Heat;Ultrasound;Gait training;Functional mobility training;Therapeutic activities;Therapeutic exercise;Balance training;Neuromuscular re-education;Patient/family education;Manual techniques;Passive range of motion;Dry needling;Taping    PT Next Visit Plan review / update HEP PRN, monitor for potential SIJ on R hamstring stretching, SLR exercise, core activation,  focus on quad and glute strengthening,    PT Home Exercise Plan [id number]- hamstring stretch seated, SLR, sidelying hipo abduction, seated low back stretch seated, LTR    Consulted and Agree with Plan of Care Patient           Patient will benefit from skilled therapeutic intervention in order to improve the following deficits and impairments:  Improper body mechanics,Increased muscle spasms,Decreased strength,Postural dysfunction,Pain,Decreased endurance  Visit Diagnosis: Chronic right-sided low back pain without sciatica  Chronic pain of right knee     Problem List Patient Active Problem List   Diagnosis Date Noted  . Abnormal stress test   . Rosacea blepharoconjunctivitis 05/17/2012  .  ABDOMINAL PAIN, UNSPECIFIED SITE 07/30/2010  .  HEMANGIOMA OF SKIN AND SUBCUTANEOUS TISSUE 05/07/2009  . ACTINIC KERATOSIS, HEAD 05/07/2009  . HYPERLIPIDEMIA 12/19/2008  . COLONIC POLYPS, HX OF 12/19/2008    [place] 11/11/2020, 5:18 PM  [facility] [address] [place], [place], [address] Phone: [phone]   Fax:  [phone]  Name: Trinidad Petron MRN: [id number] Date of Birth: Jul 14, 1950      PHYSICAL THERAPY DISCHARGE SUMMARY  Visits from Start of Care: 3  Current functional level related to goals / functional outcomes: See goals   Remaining deficits: See assessment   Education / Equipment: HEP, theraband, posture, stair biomechanics.   Plan: Patient agrees to discharge.  Patient goals were partially met. Patient is being discharged due to the patient's request.  ?????         Jolon Degante PT, DPT, LAT, ATC  11/11/20  5:20 PM

## 2020-11-17 ENCOUNTER — Encounter: Payer: Medicare HMO | Admitting: Physical Therapy

## 2020-11-24 ENCOUNTER — Encounter: Payer: Medicare HMO | Admitting: Physical Therapy

## 2020-11-25 ENCOUNTER — Other Ambulatory Visit: Payer: Self-pay | Admitting: Cardiology

## 2020-12-22 ENCOUNTER — Other Ambulatory Visit: Payer: Self-pay | Admitting: Cardiology

## 2020-12-23 ENCOUNTER — Telehealth: Payer: Self-pay | Admitting: Cardiology

## 2020-12-23 MED ORDER — ROSUVASTATIN CALCIUM 40 MG PO TABS: 40.0000 mg | ORAL_TABLET | Freq: Every day | ORAL | 1 refills | Status: DC

## 2020-12-23 NOTE — Telephone Encounter (Signed)
*  STAT* If patient is at the pharmacy, call can be transferred to refill team.   1. Which medications need to be refilled? (please list name of each medication and dose if known) Rosuvastatin   2. Which pharmacy/location (including street and city if local pharmacy) is medication to be sent to? [place] [facility]   3. Do they need a 30 day or 90 day supply? [place]

## 2020-12-23 NOTE — Telephone Encounter (Signed)
Let patient know that refills has been sent to the pharmacy.

## 2021-01-16 ENCOUNTER — Other Ambulatory Visit: Payer: Self-pay | Admitting: Cardiology

## 2021-01-19 DIAGNOSIS — Z20822 Contact with and (suspected) exposure to covid-19: Secondary | ICD-10-CM | POA: Diagnosis not present

## 2021-01-24 NOTE — Progress Notes (Addendum)
Cardiology Office Note:    Date:  01/26/2021   ID:  Julian, Crawford 26-Aug-1950, MRN [id number]  PCP:  Shon Baton, MD  Cardiologist:  Donato Heinz, MD  Electrophysiologist:  None   Referring MD: Shon Baton, MD   No chief complaint on file.   History of Present Illness:    Julian Crawford is a 70 y.o. male with a hx of CAD, hyperlipidemia who presents for follow-up.  He was referred by Dr. Virgina Jock for evaluation of palpitations, initially seen on 10/15/2019.  Reported that in prior 6 months has had 3 episodes of palpitations, where felt like heart was racing.  Can last up to 8 hours.  Checks BP and very labile.  HR has not been elevated but often says "irregular" on BP monitor.  Reports that he walks 5 to 6 miles per day.  Reports that he has been having lightheadedness with walking.  Denies any chest pain but does report dyspnea with exertion.  No smoking history.  Denies any history of heart disease in his immediate family.  Labs on 10/02/19 showed TSH was normal.  Calcium score on 06/21/18 was 176 (55th percentile).    ETT on 10/25/2019 was high risk.  ST segment elevation of 2 mm in leads V1/2, in addition to horizontal/downsloping ST depressions in inferior and lateral leads.  TTE 10/29/2019 showed normal biventricular function, grade 1 diastolic dysfunction, mild MR, SAM with 26 mmHg LVOT gradient.  Cardiac catheterization was done on 12/01/2019, which showed 50% mid LAD stenosis (not significant by RFR) and 20% D2 and mid/distal RCA lesions.  Cardiac monitor on 11/20/2019 showed atrial fibrillation/flutter with average heart rate 114 bpm; total AF burden less than 1%.  Also with occasional PVCs (3% of beats)  Since last clinic visit, reports he has been doing well.  Denies any chest pain, dyspnea, lightheadedness, syncope, lower extremity edema, or palpitations.  Walks 6 days per week for 40 minutes.  Just did 3 week trip in [place], walked at least 6 miles per day.  Denies any  bleeding issues on Eliquis.  Continues to use Kardia, denies any episodes of A. fib.  HR usually in 60s when checks at home.        Past Medical History:  Diagnosis Date   A-fib [facility])    Colon polyps    Heart murmur    Hyperlipidemia     Past Surgical History:  Procedure Laterality Date   INTRAVASCULAR PRESSURE WIRE/FFR STUDY N/A 11/04/2019   Procedure: INTRAVASCULAR PRESSURE WIRE/FFR STUDY;  Surgeon: Nelva Bush, MD;  Location: [place] [facility];  Service: Cardiovascular;  Laterality: N/A;   LEFT HEART CATH AND CORONARY ANGIOGRAPHY N/A 11/04/2019   Procedure: LEFT HEART CATH AND CORONARY ANGIOGRAPHY;  Surgeon: Nelva Bush, MD;  Location: [place] [facility];  Service: Cardiovascular;  Laterality: N/A;   PILONIDAL CYST EXCISION     TONSILLECTOMY      Current Medications: Current Meds  Medication Sig   cholecalciferol (VITAMIN D) 1000 units tablet Take 1,000 Units by mouth daily.   ELIQUIS 5 MG TABS tablet TAKE 1 TABLET BY MOUTH TWICE A DAY   ezetimibe (ZETIA) 10 MG tablet TAKE ONE TABLET BY MOUTH DAILY   metoprolol tartrate (LOPRESSOR) 25 MG tablet Take 0.5 tablets (12.5 mg total) by mouth 2 (two) times daily.   multivitamin (THERAGRAN) per tablet Take 1 tablet by mouth daily.   niacin (NIASPAN) 500 MG CR tablet Take 500 mg by mouth daily.  Omega-3 Fatty Acids (FISH OIL OMEGA-3 PO) Take 1,400 mg by mouth in the morning and at bedtime.   Propylene Glycol-Glycerin (SOOTHE) 0.6-0.6 % SOLN Place 1 drop into both eyes daily as needed.   rosuvastatin (CRESTOR) 40 MG tablet Take 1 tablet (40 mg total) by mouth daily.   vitamin C (ASCORBIC ACID) 500 MG tablet Take 500 mg by mouth daily.   zolpidem (AMBIEN) 5 MG tablet Take 5 mg by mouth at bedtime as needed for sleep.     Allergies:   Patient has no known allergies.   Social History   Socioeconomic History   Marital status: Married    Spouse name: Not on file   Number of children: Not on file   Years of education: Not on  file   Highest education level: Not on file  Occupational History   Not on file  Tobacco Use   Smoking status: Never   Smokeless tobacco: Never  Substance and Sexual Activity   Alcohol use: Yes    Alcohol/week: 7.0 - 14.0 standard drinks    Types: 7 - 14 Glasses of wine per week   Drug use: No   Sexual activity: Yes  Other Topics Concern   Not on file  Social History Narrative   Not on file   Social Determinants of Health   Financial Resource Strain: Not on file  Food Insecurity: Not on file  Transportation Needs: Not on file  Physical Activity: Not on file  Stress: Not on file  Social Connections: Not on file     Family History: The patient's family history includes Alzheimer's disease in his father; Dementia in his father and mother; Heart disease in his father; Hyperlipidemia in his mother. There is no history of Colon cancer.  ROS:   Please see the history of present illness.     All other systems reviewed and are negative.  EKGs/Labs/Other Studies Reviewed:    The following studies were reviewed today:   EKG:  EKG is ordered today.  The ekg ordered demonstrates normal sinus rhythm with PVC, rate 50, no ST/T abnormalities  ETT 10/25/19: ST segment elevation of 2 mm was noted during stress in the V1 and V2 leads, beginning at 5 minutes of stress, and returning to baseline after 1-5 minutes of recovery.   High risk stress test, very concerning for ischemia. There are ST elevations in V1 and V2 with stage 2 exercise, as well as horizontal and downsloping ST depressions in the inferior and lateral leads. Findings communicated with Dr. Gardiner Rhyme.  Cath 11/04/19: Conclusions: Mild to moderate, non-obstructive coronary artery disease, including 50% mid LAD stenosis (not hemodynamically significant by RFR) and 20% D2 and mid/distal RCA lesions. Normal left ventricular filling pressure.   Recommendations: Medical therapy and risk factor modification to prevent progression  of disease.   Cardiac monitor 11/20/19: 3 episodes of atrial fibrillation/atrial flutter with rates 92-163 (average 114 bpm). Less than 1% atrial fibrillation burden. Longest episode lasted 1 hour. Occasional PVCs (3% of beats)   Predominant rhythm is sinus rhythm. Range is 56 to 163 bpm with average of 80 bpm.  3 episodes of atrial fibrillation/atrial flutter, longest lasting 1 hour.  Rates the 92-163 (average 114 bpm).  Less than 1% atrial fibrillation burden.   4 beat run of NSVT.  No sustained ventricular tachycardia, significant pause, or high degree AV block. Total ventricular ectopy 3%. 3 patient triggered events, corresponding to sinus rhythm.  Recent Labs: No results found for requested labs  within last 8760 hours.  Recent Lipid Panel    Component Value Date/Time   CHOL 142 02/17/2020 0943   TRIG 105 02/17/2020 0943   HDL 46 02/17/2020 0943   CHOLHDL 3.1 02/17/2020 0943   CHOLHDL 5 05/27/2013 0811   VLDL 23.0 05/27/2013 0811   LDLCALC 77 02/17/2020 0943   LDLDIRECT 146.5 05/27/2013 0811    Physical Exam:    VS:  BP 104/70 (BP Location: Right Arm)   Pulse (!) 50   Ht '5\' 10"'$  (1.778 m)   Wt 152 lb 9.6 oz (69.2 kg)   BMI 21.90 kg/m     Wt Readings from Last 3 Encounters:  01/26/21 152 lb 9.6 oz (69.2 kg)  09/21/20 155 lb (70.3 kg)  07/29/20 162 lb (73.5 kg)     GEN:  Well nourished, well developed in no acute distress HEENT: Normal NECK: No JVD CARDIAC: RRR, no murmurs, rubs, gallops RESPIRATORY:  Clear to auscultation without rales, wheezing or rhonchi  ABDOMEN: Soft, non-tender, non-distended MUSCULOSKELETAL:  No edema; No deformity  SKIN: Warm and dry NEUROLOGIC:  Alert and oriented x 3 PSYCHIATRIC:  Normal affect   ASSESSMENT:    1. Paroxysmal atrial fibrillation (HCC)   2. Coronary artery disease involving native coronary artery of native heart without angina pectoris   3. Hyperlipidemia, unspecified hyperlipidemia type     PLAN:    Atrial  fibrillation/flutter: Diagnosed on cardiac monitor on 11/20/2019 (<1% burden).  CHA2DS2-VASc score 2 (age, CAD) -Continue metoprolol 12.5 mg twice daily -Continue Eliquis 5 mg twice daily -Continue to monitor for Afib recurrence with Kardia device, he denies any recent episodes.  Consider referral for ablation if increased AF burden  CAD: Calcium score 176 (75th percentile) on 06/21/2018.  LDL 96 on 06/13/2019.  ETT high risk, with ST segment elevation of 2 mm in leads V1/2, in addition to horizontal/downsloping ST depressions in inferior and lateral leads.  Cath 11/04/19 showed 50% mid LAD stenosis (not significant by RFR) and 20% D2 and mid/distal RCA lesions.  TTE shows normal LV systolic function.  No anginal symptoms -Continue Eliquis as above -Continue rosuvastatin 40 mg daily    Hyperlipidemia: LDL 77 on 02/17/2020, on rosuvastatin 40 mg daily.  Zetia 10 mg daily was added, LDL 43 on 06/15/20    RTC as needed.  He recently moved to [facility], will place referral to [facility] cardiology and f/u here as needed   Medication Adjustments/Labs and Tests Ordered: Current medicines are reviewed at length with the patient today.  Concerns regarding medicines are outlined above.  Orders Placed This Encounter  Procedures   Ambulatory referral to Cardiology   EKG 12-Lead    No orders of the defined types were placed in this encounter.   Patient Instructions  Medication Instructions:  No changes *If you need a refill on your cardiac medications before your next appointment, please call your pharmacy*   Lab Work: None ordered If you have labs (blood work) drawn today and your tests are completely normal, you will receive your results only by: [place] (if you have MyChart) OR A paper copy in the mail If you have any lab test that is abnormal or we need to change your treatment, we will call you to review the results.   Testing/Procedures: None ordered   Follow-Up: At [facility], you and your health needs are our priority.  As part of our continuing mission to provide you with exceptional heart care, we have created designated  Provider Care Teams.  These Care Teams include your primary Cardiologist (physician) and Advanced Practice Providers (APPs -  Physician Assistants and Nurse Practitioners) who all work together to provide you with the care you need, when you need it.  We recommend signing up for the patient portal called "MyChart".  Sign up information is provided on this After Visit Summary.  MyChart is used to connect with patients for Virtual Visits (Telemedicine).  Patients are able to view lab/test results, encounter notes, upcoming appointments, etc.  Non-urgent messages can be sent to your provider as well.   To learn more about what you can do with MyChart, go to [email or web address].    Your next appointment:   Follow up as needed with Dr. Gardiner Rhyme  A referral has been placed to [facility] Cardiology     Signed, Donato Heinz, MD  01/26/2021 9:44 AM    [place]

## 2021-01-26 ENCOUNTER — Encounter: Payer: Self-pay | Admitting: Cardiology

## 2021-01-26 ENCOUNTER — Other Ambulatory Visit: Payer: Self-pay

## 2021-01-26 ENCOUNTER — Ambulatory Visit: Payer: Medicare HMO | Admitting: Cardiology

## 2021-01-26 VITALS — BP 104/70 | HR 50 | Ht 70.0 in | Wt 152.6 lb

## 2021-01-26 DIAGNOSIS — E785 Hyperlipidemia, unspecified: Secondary | ICD-10-CM

## 2021-01-26 DIAGNOSIS — I480 Paroxysmal atrial fibrillation: Secondary | ICD-10-CM | POA: Diagnosis not present

## 2021-01-26 DIAGNOSIS — I2510 Atherosclerotic heart disease of native coronary artery without angina pectoris: Secondary | ICD-10-CM | POA: Diagnosis not present

## 2021-01-26 NOTE — Patient Instructions (Signed)
Medication Instructions:  No changes *If you need a refill on your cardiac medications before your next appointment, please call your pharmacy*   Lab Work: None ordered If you have labs (blood work) drawn today and your tests are completely normal, you will receive your results only by: [place] (if you have MyChart) OR A paper copy in the mail If you have any lab test that is abnormal or we need to change your treatment, we will call you to review the results.   Testing/Procedures: None ordered   Follow-Up: At [facility], you and your health needs are our priority.  As part of our continuing mission to provide you with exceptional heart care, we have created designated Provider Care Teams.  These Care Teams include your primary Cardiologist (physician) and Advanced Practice Providers (APPs -  Physician Assistants and Nurse Practitioners) who all work together to provide you with the care you need, when you need it.  We recommend signing up for the patient portal called "MyChart".  Sign up information is provided on this After Visit Summary.  MyChart is used to connect with patients for Virtual Visits (Telemedicine).  Patients are able to view lab/test results, encounter notes, upcoming appointments, etc.  Non-urgent messages can be sent to your provider as well.   To learn more about what you can do with MyChart, go to [email or web address].    Your next appointment:   Follow up as needed with Dr. Gardiner Rhyme  A referral has been placed to [facility] Cardiology

## 2021-02-05 DIAGNOSIS — Z20822 Contact with and (suspected) exposure to covid-19: Secondary | ICD-10-CM | POA: Diagnosis not present

## 2021-02-05 DIAGNOSIS — Z03818 Encounter for observation for suspected exposure to other biological agents ruled out: Secondary | ICD-10-CM | POA: Diagnosis not present

## 2021-04-03 DIAGNOSIS — Z23 Encounter for immunization: Secondary | ICD-10-CM | POA: Diagnosis not present

## 2021-04-05 ENCOUNTER — Telehealth: Payer: Self-pay

## 2021-04-05 DIAGNOSIS — Z01810 Encounter for preprocedural cardiovascular examination: Secondary | ICD-10-CM

## 2021-04-05 DIAGNOSIS — Z79899 Other long term (current) drug therapy: Secondary | ICD-10-CM

## 2021-04-05 NOTE — Telephone Encounter (Signed)
   [facility] Pre-operative Risk Assessment    Patient Name: Julian Crawford  DOB: 11/30/1950 MRN: [id number]   Request for surgical clearance:  What type of surgery is being performed? COLONOSCOPY  When is this surgery scheduled? TBD  What type of clearance is required (medical clearance vs. Pharmacy clearance to hold med vs. Both)? MEDICATION  Are there any medications that need to be held prior to surgery and how long? ELIQUIS 2+ DAYS  Practice name and name of physician performing surgery? [facility]   What is the office phone number? [phone]   7.   What is the office fax number? [phone]  8.   Anesthesia type (None, local, MAC, general) ? NOT LISTED

## 2021-04-07 DIAGNOSIS — Z79899 Other long term (current) drug therapy: Secondary | ICD-10-CM | POA: Diagnosis not present

## 2021-04-07 DIAGNOSIS — Z01810 Encounter for preprocedural cardiovascular examination: Secondary | ICD-10-CM | POA: Diagnosis not present

## 2021-04-07 NOTE — Telephone Encounter (Signed)
No labs in system in the past year.  Please have patient get CBC, BMET.

## 2021-04-07 NOTE — Telephone Encounter (Signed)
Called pt and he states that there is a [facility] in [place] where he lives. He will go there today. Verbalized understanding that we cannot clear him until we get these lab results.

## 2021-04-07 NOTE — Telephone Encounter (Signed)
Will route to callback to please get labs, OK to put under my name, non-fasting.

## 2021-04-08 LAB — BASIC METABOLIC PANEL
BUN/Creatinine Ratio: 15 (ref 10–24)
BUN: 13 mg/dL (ref 8–27)
CO2: 26 mmol/L (ref 20–29)
Calcium: 9.3 mg/dL (ref 8.6–10.2)
Chloride: 100 mmol/L (ref 96–106)
Creatinine, Ser: 0.86 mg/dL (ref 0.76–1.27)
Glucose: 94 mg/dL (ref 70–99)
Potassium: 4.2 mmol/L (ref 3.5–5.2)
Sodium: 140 mmol/L (ref 134–144)
eGFR: 93 mL/min/{1.73_m2} (ref >59)

## 2021-04-08 LAB — CBC
Hematocrit: 40.2 % (ref 37.5–51.0)
Hemoglobin: 13.2 g/dL (ref 13.0–17.7)
MCH: 31 pg (ref 26.6–33.0)
MCHC: 32.8 g/dL (ref 31.5–35.7)
MCV: 94 fL (ref 79–97)
Platelets: 171 10*3/uL (ref 150–450)
RBC: 4.26 x10E6/uL (ref 4.14–5.80)
RDW: 12.2 % (ref 11.6–15.4)
WBC: 8.2 10*3/uL (ref 3.4–10.8)

## 2021-04-08 NOTE — Telephone Encounter (Signed)
   Name: Julian Crawford  DOB: 27-Nov-1950  MRN: [id number]   Primary Cardiologist: Donato Heinz, MD  Chart reviewed as part of pre-operative protocol coverage. Patient was contacted 04/08/2021 in reference to pre-operative risk assessment for pending surgery as outlined below.  Ferdinando Lodge Mogel was last seen on 01/2021 by Dr. Gardiner Rhyme, chart reviewed, doing great at that [place] with plenty of exercise. Reached out to patient to ensure no new symptoms or changes in history (was going to be establishing with [facility] Cardiology for follow-up but do not see any CareEverywhere notes on this.   Tried to call pt - got VM. [place]. Hopeful to clear once patient returns call if doing well. Will also need to relay that his CBC/BMET were normal that were drawn.   Charlie Pitter, PA-C 04/08/2021, 4:45 PM

## 2021-04-08 NOTE — Telephone Encounter (Signed)
Will route to pharmD team that CBC, BMET now available in [place].

## 2021-04-08 NOTE — Telephone Encounter (Signed)
Patient with diagnosis of afib on Eliquis for anticoagulation.    Procedure: colonoscopy Date of procedure: TBD  CHA2DS2-VASc Score = 2  This indicates a 2.2% annual risk of stroke. The patient's score is based upon: CHF History: 0 HTN History: 0 Diabetes History: 0 Stroke History: 0 Vascular Disease History: 1 Age Score: 1 Gender Score: 0   CrCl 6mL/min Platelet count 171K  Per office protocol, patient can hold Eliquis for 2 days prior to procedure.

## 2021-04-09 NOTE — Telephone Encounter (Signed)
Patient returning call.

## 2021-04-13 NOTE — Telephone Encounter (Signed)
   Name: Julian Crawford  DOB: Mar 04, 1951  MRN: [id number]   Primary Cardiologist: Donato Heinz, MD  Chart reviewed as part of pre-operative protocol coverage. Patient was contacted 04/13/2021 in reference to pre-operative risk assessment for pending surgery as outlined below.  Julian Crawford was last seen on 01/26/2021 by Dr. Gardiner Rhyme.  Since that day, Julian Crawford has done well without chest pain or worsening dyspnea.  Therefore, based on ACC/AHA guidelines, the patient would be at acceptable risk for the planned procedure without further cardiovascular testing.   Based on recommendation by our clinical pharmacist, he may hold Eliquis for 2 days prior to the procedure and restart it as soon as possible afterward at the surgeon's discretion.  The patient was advised that if he develops new symptoms prior to surgery to contact our office to arrange for a follow-up visit, and he verbalized understanding.  I will route this recommendation to the requesting party via Epic fax function and remove from pre-op pool. Please call with questions.  Almyra Deforest, [place] 04/13/2021, 8:47 AM

## 2021-04-15 ENCOUNTER — Other Ambulatory Visit: Payer: Self-pay | Admitting: Cardiology

## 2021-04-15 NOTE — Telephone Encounter (Signed)
Prescription refill request for Eliquis received. Indication:Afib Last office visit:7/22 Scr:0.8 Age: 70 Weight:69.2 kg  Prescription refilled

## 2021-05-31 DIAGNOSIS — H2513 Age-related nuclear cataract, bilateral: Secondary | ICD-10-CM | POA: Diagnosis not present

## 2021-07-08 DIAGNOSIS — Z1211 Encounter for screening for malignant neoplasm of colon: Secondary | ICD-10-CM | POA: Diagnosis not present

## 2021-07-08 DIAGNOSIS — K648 Other hemorrhoids: Secondary | ICD-10-CM | POA: Diagnosis not present

## 2021-07-08 DIAGNOSIS — K5730 Diverticulosis of large intestine without perforation or abscess without bleeding: Secondary | ICD-10-CM | POA: Diagnosis not present

## 2021-07-08 DIAGNOSIS — Z86010 Personal history of colonic polyps: Secondary | ICD-10-CM | POA: Diagnosis not present

## 2021-07-08 DIAGNOSIS — D120 Benign neoplasm of cecum: Secondary | ICD-10-CM | POA: Diagnosis not present

## 2021-07-08 DIAGNOSIS — D125 Benign neoplasm of sigmoid colon: Secondary | ICD-10-CM | POA: Diagnosis not present

## 2021-07-08 DIAGNOSIS — K635 Polyp of colon: Secondary | ICD-10-CM | POA: Diagnosis not present

## 2021-08-06 DIAGNOSIS — E039 Hypothyroidism, unspecified: Secondary | ICD-10-CM | POA: Diagnosis not present

## 2021-08-06 DIAGNOSIS — Z0100 Encounter for examination of eyes and vision without abnormal findings: Secondary | ICD-10-CM | POA: Diagnosis not present

## 2021-08-06 DIAGNOSIS — Z125 Encounter for screening for malignant neoplasm of prostate: Secondary | ICD-10-CM | POA: Diagnosis not present

## 2021-08-06 DIAGNOSIS — E559 Vitamin D deficiency, unspecified: Secondary | ICD-10-CM | POA: Diagnosis not present

## 2021-08-06 DIAGNOSIS — R739 Hyperglycemia, unspecified: Secondary | ICD-10-CM | POA: Diagnosis not present

## 2021-08-06 DIAGNOSIS — E785 Hyperlipidemia, unspecified: Secondary | ICD-10-CM | POA: Diagnosis not present

## 2021-08-10 ENCOUNTER — Encounter: Payer: Self-pay | Admitting: Gastroenterology

## 2021-08-11 DIAGNOSIS — E785 Hyperlipidemia, unspecified: Secondary | ICD-10-CM | POA: Diagnosis not present

## 2021-08-11 DIAGNOSIS — I2510 Atherosclerotic heart disease of native coronary artery without angina pectoris: Secondary | ICD-10-CM | POA: Diagnosis not present

## 2021-08-11 DIAGNOSIS — I480 Paroxysmal atrial fibrillation: Secondary | ICD-10-CM | POA: Diagnosis not present

## 2021-08-11 DIAGNOSIS — Q208 Other congenital malformations of cardiac chambers and connections: Secondary | ICD-10-CM | POA: Diagnosis not present

## 2021-08-13 DIAGNOSIS — E785 Hyperlipidemia, unspecified: Secondary | ICD-10-CM | POA: Diagnosis not present

## 2021-08-13 DIAGNOSIS — R739 Hyperglycemia, unspecified: Secondary | ICD-10-CM | POA: Diagnosis not present

## 2021-08-13 DIAGNOSIS — R82998 Other abnormal findings in urine: Secondary | ICD-10-CM | POA: Diagnosis not present

## 2021-08-13 DIAGNOSIS — I480 Paroxysmal atrial fibrillation: Secondary | ICD-10-CM | POA: Diagnosis not present

## 2021-08-13 DIAGNOSIS — K429 Umbilical hernia without obstruction or gangrene: Secondary | ICD-10-CM | POA: Diagnosis not present

## 2021-08-13 DIAGNOSIS — K5730 Diverticulosis of large intestine without perforation or abscess without bleeding: Secondary | ICD-10-CM | POA: Diagnosis not present

## 2021-08-13 DIAGNOSIS — E039 Hypothyroidism, unspecified: Secondary | ICD-10-CM | POA: Diagnosis not present

## 2021-08-13 DIAGNOSIS — Z0000 Encounter for general adult medical examination without abnormal findings: Secondary | ICD-10-CM | POA: Diagnosis not present

## 2021-08-13 DIAGNOSIS — I2584 Coronary atherosclerosis due to calcified coronary lesion: Secondary | ICD-10-CM | POA: Diagnosis not present

## 2021-08-13 DIAGNOSIS — E559 Vitamin D deficiency, unspecified: Secondary | ICD-10-CM | POA: Diagnosis not present

## 2021-08-13 DIAGNOSIS — Z7901 Long term (current) use of anticoagulants: Secondary | ICD-10-CM | POA: Diagnosis not present

## 2021-08-18 ENCOUNTER — Telehealth: Payer: Self-pay | Admitting: Gastroenterology

## 2021-08-18 NOTE — Telephone Encounter (Signed)
Inbound call from patient stating that he received a letter in the mail that it was time for his colonoscopy. Patient stated that he has moved out of town and that he has already had a colonoscopy elsewhere.

## 2021-08-18 NOTE — Telephone Encounter (Signed)
Patient called in saying he received a recall letter for a colonoscopy, however he recently moved & has changed to a new GI office. Just an fyi.

## 2021-08-18 NOTE — Telephone Encounter (Signed)
Please remove his colonoscopy recall from our system as he has changed GI practices.

## 2021-09-27 DIAGNOSIS — I080 Rheumatic disorders of both mitral and aortic valves: Secondary | ICD-10-CM | POA: Diagnosis not present

## 2021-09-27 DIAGNOSIS — I2510 Atherosclerotic heart disease of native coronary artery without angina pectoris: Secondary | ICD-10-CM | POA: Diagnosis not present

## 2021-09-29 DIAGNOSIS — I4811 Longstanding persistent atrial fibrillation: Secondary | ICD-10-CM | POA: Diagnosis not present

## 2021-09-29 DIAGNOSIS — Z1331 Encounter for screening for depression: Secondary | ICD-10-CM | POA: Diagnosis not present

## 2021-09-29 DIAGNOSIS — I10 Essential (primary) hypertension: Secondary | ICD-10-CM | POA: Diagnosis not present

## 2021-09-29 DIAGNOSIS — I340 Nonrheumatic mitral (valve) insufficiency: Secondary | ICD-10-CM | POA: Diagnosis not present

## 2021-12-17 ENCOUNTER — Other Ambulatory Visit: Payer: Self-pay | Admitting: Cardiology

## 2022-05-31 DIAGNOSIS — H25813 Combined forms of age-related cataract, bilateral: Secondary | ICD-10-CM | POA: Diagnosis not present

## 2022-08-11 DIAGNOSIS — I480 Paroxysmal atrial fibrillation: Secondary | ICD-10-CM | POA: Diagnosis not present

## 2022-08-11 DIAGNOSIS — I2510 Atherosclerotic heart disease of native coronary artery without angina pectoris: Secondary | ICD-10-CM | POA: Diagnosis not present

## 2022-08-11 DIAGNOSIS — E782 Mixed hyperlipidemia: Secondary | ICD-10-CM | POA: Diagnosis not present

## 2022-08-18 DIAGNOSIS — R69 Illness, unspecified: Secondary | ICD-10-CM | POA: Diagnosis not present

## 2022-08-30 DIAGNOSIS — D485 Neoplasm of uncertain behavior of skin: Secondary | ICD-10-CM | POA: Diagnosis not present

## 2022-08-30 DIAGNOSIS — D0462 Carcinoma in situ of skin of left upper limb, including shoulder: Secondary | ICD-10-CM | POA: Diagnosis not present

## 2022-08-30 DIAGNOSIS — L218 Other seborrheic dermatitis: Secondary | ICD-10-CM | POA: Diagnosis not present

## 2022-09-27 DIAGNOSIS — X32XXXA Exposure to sunlight, initial encounter: Secondary | ICD-10-CM | POA: Diagnosis not present

## 2022-09-27 DIAGNOSIS — D0462 Carcinoma in situ of skin of left upper limb, including shoulder: Secondary | ICD-10-CM | POA: Diagnosis not present

## 2022-09-27 DIAGNOSIS — L218 Other seborrheic dermatitis: Secondary | ICD-10-CM | POA: Diagnosis not present

## 2022-09-27 DIAGNOSIS — L570 Actinic keratosis: Secondary | ICD-10-CM | POA: Diagnosis not present

## 2022-10-19 DIAGNOSIS — E559 Vitamin D deficiency, unspecified: Secondary | ICD-10-CM | POA: Diagnosis not present

## 2022-10-19 DIAGNOSIS — Z7901 Long term (current) use of anticoagulants: Secondary | ICD-10-CM | POA: Diagnosis not present

## 2022-10-19 DIAGNOSIS — Z0000 Encounter for general adult medical examination without abnormal findings: Secondary | ICD-10-CM | POA: Diagnosis not present

## 2022-10-19 DIAGNOSIS — I480 Paroxysmal atrial fibrillation: Secondary | ICD-10-CM | POA: Diagnosis not present

## 2022-10-19 DIAGNOSIS — Z1159 Encounter for screening for other viral diseases: Secondary | ICD-10-CM | POA: Diagnosis not present

## 2022-10-19 DIAGNOSIS — E782 Mixed hyperlipidemia: Secondary | ICD-10-CM | POA: Diagnosis not present

## 2022-10-19 DIAGNOSIS — Z79899 Other long term (current) drug therapy: Secondary | ICD-10-CM | POA: Diagnosis not present

## 2022-10-19 DIAGNOSIS — Z0184 Encounter for antibody response examination: Secondary | ICD-10-CM | POA: Diagnosis not present

## 2022-10-19 DIAGNOSIS — Z125 Encounter for screening for malignant neoplasm of prostate: Secondary | ICD-10-CM | POA: Diagnosis not present

## 2022-10-19 DIAGNOSIS — Z7182 Exercise counseling: Secondary | ICD-10-CM | POA: Diagnosis not present

## 2022-11-07 DIAGNOSIS — E785 Hyperlipidemia, unspecified: Secondary | ICD-10-CM | POA: Diagnosis not present

## 2022-11-07 DIAGNOSIS — E039 Hypothyroidism, unspecified: Secondary | ICD-10-CM | POA: Diagnosis not present

## 2022-11-07 DIAGNOSIS — J069 Acute upper respiratory infection, unspecified: Secondary | ICD-10-CM | POA: Diagnosis not present

## 2022-11-07 DIAGNOSIS — I2584 Coronary atherosclerosis due to calcified coronary lesion: Secondary | ICD-10-CM | POA: Diagnosis not present

## 2022-11-07 DIAGNOSIS — Z7901 Long term (current) use of anticoagulants: Secondary | ICD-10-CM | POA: Diagnosis not present

## 2022-11-07 DIAGNOSIS — R972 Elevated prostate specific antigen [PSA]: Secondary | ICD-10-CM | POA: Diagnosis not present

## 2022-11-07 DIAGNOSIS — D6869 Other thrombophilia: Secondary | ICD-10-CM | POA: Diagnosis not present

## 2022-11-07 DIAGNOSIS — I480 Paroxysmal atrial fibrillation: Secondary | ICD-10-CM | POA: Diagnosis not present

## 2022-12-29 DIAGNOSIS — B360 Pityriasis versicolor: Secondary | ICD-10-CM | POA: Diagnosis not present

## 2022-12-29 DIAGNOSIS — Z85828 Personal history of other malignant neoplasm of skin: Secondary | ICD-10-CM | POA: Diagnosis not present

## 2022-12-29 DIAGNOSIS — Z08 Encounter for follow-up examination after completed treatment for malignant neoplasm: Secondary | ICD-10-CM | POA: Diagnosis not present

## 2022-12-29 DIAGNOSIS — X32XXXA Exposure to sunlight, initial encounter: Secondary | ICD-10-CM | POA: Diagnosis not present

## 2022-12-29 DIAGNOSIS — L218 Other seborrheic dermatitis: Secondary | ICD-10-CM | POA: Diagnosis not present

## 2022-12-29 DIAGNOSIS — L820 Inflamed seborrheic keratosis: Secondary | ICD-10-CM | POA: Diagnosis not present

## 2022-12-29 DIAGNOSIS — L570 Actinic keratosis: Secondary | ICD-10-CM | POA: Diagnosis not present

## 2023-02-07 DIAGNOSIS — Z0100 Encounter for examination of eyes and vision without abnormal findings: Secondary | ICD-10-CM | POA: Diagnosis not present

## 2023-03-24 DIAGNOSIS — Z08 Encounter for follow-up examination after completed treatment for malignant neoplasm: Secondary | ICD-10-CM | POA: Diagnosis not present

## 2023-03-24 DIAGNOSIS — L821 Other seborrheic keratosis: Secondary | ICD-10-CM | POA: Diagnosis not present

## 2023-03-24 DIAGNOSIS — Z85828 Personal history of other malignant neoplasm of skin: Secondary | ICD-10-CM | POA: Diagnosis not present

## 2023-03-24 DIAGNOSIS — L309 Dermatitis, unspecified: Secondary | ICD-10-CM | POA: Diagnosis not present

## 2023-04-24 DIAGNOSIS — L400 Psoriasis vulgaris: Secondary | ICD-10-CM | POA: Diagnosis not present

## 2023-04-24 DIAGNOSIS — L570 Actinic keratosis: Secondary | ICD-10-CM | POA: Diagnosis not present

## 2023-04-24 DIAGNOSIS — X32XXXA Exposure to sunlight, initial encounter: Secondary | ICD-10-CM | POA: Diagnosis not present

## 2023-06-06 DIAGNOSIS — R197 Diarrhea, unspecified: Secondary | ICD-10-CM | POA: Diagnosis not present

## 2023-06-06 DIAGNOSIS — K5000 Crohn's disease of small intestine without complications: Secondary | ICD-10-CM | POA: Diagnosis not present

## 2023-06-13 DIAGNOSIS — H25813 Combined forms of age-related cataract, bilateral: Secondary | ICD-10-CM | POA: Diagnosis not present

## 2023-06-13 DIAGNOSIS — H04123 Dry eye syndrome of bilateral lacrimal glands: Secondary | ICD-10-CM | POA: Diagnosis not present
